# Patient Record
Sex: Female | Born: 2000 | Race: White | Hispanic: No | Marital: Married | Smoking: Never smoker
Health system: Southern US, Community
[De-identification: ages and names within clinical notes are randomized; demographics above are authoritative.]

## PROBLEM LIST (undated history)

## (undated) DIAGNOSIS — N83209 Unspecified ovarian cyst, unspecified side: Secondary | ICD-10-CM

## (undated) HISTORY — DX: Unspecified ovarian cyst, unspecified side: N83.209

## (undated) HISTORY — PX: NO PAST SURGERIES: SHX2092

---
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
## 2018-12-01 ENCOUNTER — Encounter: Payer: Self-pay | Admitting: Emergency Medicine

## 2018-12-01 ENCOUNTER — Emergency Department
Admission: EM | Admit: 2018-12-01 | Discharge: 2018-12-01 | Disposition: A | Discharge status: Home / Self Care | Payer: BLUE CROSS/BLUE SHIELD | Attending: Emergency Medicine | Admitting: Emergency Medicine

## 2018-12-01 DIAGNOSIS — Z79899 Other long term (current) drug therapy: Secondary | ICD-10-CM | POA: Diagnosis not present

## 2018-12-01 DIAGNOSIS — M5442 Lumbago with sciatica, left side: Secondary | ICD-10-CM | POA: Insufficient documentation

## 2018-12-01 DIAGNOSIS — G8929 Other chronic pain: Secondary | ICD-10-CM

## 2018-12-01 DIAGNOSIS — M545 Low back pain: Secondary | ICD-10-CM | POA: Diagnosis present

## 2018-12-01 MED ORDER — PREDNISONE 10 MG PO TABS: ORAL_TABLET | ORAL | 0 refills | Status: DC

## 2018-12-01 MED ORDER — DEXAMETHASONE SODIUM PHOSPHATE 10 MG/ML IJ SOLN
10.0000 mg | Freq: Once | INTRAMUSCULAR | Status: AC
  Administered 2018-12-01: 10 mg via INTRAMUSCULAR
  Filled 2018-12-01: qty 1

## 2018-12-01 MED ORDER — HYDROCODONE-ACETAMINOPHEN 5-325 MG PO TABS: 1.0000 | ORAL_TABLET | Freq: Four times a day (QID) | ORAL | 0 refills | Status: DC | PRN

## 2018-12-01 MED ORDER — OXYCODONE-ACETAMINOPHEN 5-325 MG PO TABS
1.0000 | ORAL_TABLET | Freq: Once | ORAL | Status: AC
  Administered 2018-12-01: 1 via ORAL
  Filled 2018-12-01: qty 1

## 2018-12-01 NOTE — ED Triage Notes (Signed)
First RN Note: Pt presents to ED via POV with c/o lower back and L leg pain. Pt states low back pain since Janurary and L leg pain x several weeks. Pt states was seen at [facility] and was supposed to be scheduled for MRI but has not heard back.

## 2018-12-01 NOTE — ED Notes (Signed)
Pt verbalized understanding of discharge instructions. NAD at this time. 

## 2018-12-01 NOTE — ED Triage Notes (Signed)
Pt to ED with c/o of lower pain that radiates to left leg.

## 2018-12-01 NOTE — ED Notes (Signed)
See triage note  Presents with lower back pain  States pain started in Jan  But has progressively gotten worse  Denies any injury  States pain is moving into left leg

## 2018-12-01 NOTE — Discharge Instructions (Signed)
Follow-up with your orthopedist by phone on Monday if any continued problems.  At this time discontinue taking the meloxicam while you are taking the prednisone.  Prednisone is a taper dose and begin taking today.

## 2018-12-01 NOTE — ED Provider Notes (Signed)
[facility] Emergency Department Provider Note   ____________________________________________   First MD Initiated Contact with Patient 12/01/18 1336     (approximate)  I have reviewed the triage vital signs and the nursing notes.   HISTORY  Chief Complaint Back Pain and Leg Pain   HPI Grace Lopez is a 18 y.o. female presents to the ED with exacerbation of her low back pain.  Patient started experiencing pain in January and has been seen at [facility] when she experienced radiculopathy into her left leg.  She was seen by them this week and states that they are scheduling for her to have an MRI.  She denies any new injury, saddle anesthesias or incontinence of bowel or bladder.  Patient continues to be ambulatory without any assistance.  Currently she rates her pain as 7 out of 10.       There are no active problems to display for this patient.   History reviewed. No pertinent surgical history.  Prior to Admission medications   Medication Sig Start Date End Date Taking? Authorizing Provider  cyclobenzaprine (FLEXERIL) 5 MG tablet Take 5 mg by mouth 3 (three) times daily as needed for muscle spasms.   Yes [provider]  meloxicam (MOBIC) 15 MG tablet Take 15 mg by mouth daily.   Yes [provider]  HYDROcodone-acetaminophen (NORCO/VICODIN) 5-325 MG tablet Take 1 tablet by mouth every 6 (six) hours as needed for moderate pain. 12/01/18   Tommi Rumps, PA-C  predniSONE (DELTASONE) 10 MG tablet Take 6 tablets  today, on day 2 take 5 tablets, day 3 take 4 tablets, day 4 take 3 tablets, day 5 take  2 tablets and 1 tablet the last day 12/01/18   Tommi Rumps, PA-C    Allergies Patient has no known allergies.  History reviewed. No pertinent family history.  Social History Social History   Tobacco Use  . Smoking status: Never Smoker  . Smokeless tobacco: Never Used  Substance Use Topics  . Alcohol use: Never   Frequency: Never  . Drug use: Never    Review of Systems Constitutional: No fever/chills Cardiovascular: Denies chest pain. Respiratory: Denies shortness of breath. Genitourinary: Negative for dysuria. Musculoskeletal: Positive for chronic back pain with left leg sciatica. Skin: Negative for rash. Neurological: Negative for headaches ___________________________________________   PHYSICAL EXAM:  VITAL SIGNS: ED Triage Vitals [12/01/18 1323]  Enc Vitals Group     BP 119/80     Pulse Rate 97     Resp 18     Temp 98.3 F (36.8 C)     Temp Source Oral     SpO2 100 %     Weight 190 lb (86.2 kg)     Height 5\' 3"  (1.6 m)     Head Circumference      Peak Flow      Pain Score 7     Pain Loc      Pain Edu?      Excl. in GC?    Constitutional: Alert and oriented. Well appearing and in no acute distress. Eyes: Conjunctivae are normal.  Head: Atraumatic. Neck: No stridor.   Cardiovascular: Normal rate, regular rhythm. Grossly normal heart sounds.  Good peripheral circulation. Respiratory: Normal respiratory effort.  No retractions. Lungs CTAB. Gastrointestinal: Soft and nontender. No distention.  Musculoskeletal: There is no point tenderness on palpation of the lumbar spine or step-offs appreciated.  There is decreased range of motion secondary to discomfort but no  active muscle spasms were noted.  Good muscle strength bilaterally.  Straight leg raises are approximately 30 degrees bilaterally. Neurologic:  Normal speech and language. No gross focal neurologic deficits are appreciated.  Reflexes are 2+ bilaterally.  No gait instability. Skin:  Skin is warm, dry and intact. No rash noted. Psychiatric: Mood and affect are normal. Speech and behavior are normal.  ____________________________________________   LABS (all labs ordered are listed, but only abnormal results are displayed)  Labs Reviewed - No data to display   PROCEDURES  Procedure(s) performed (including Critical  Care):  Procedures   ____________________________________________   INITIAL IMPRESSION / ASSESSMENT AND PLAN / ED COURSE  As part of my medical decision making, I reviewed the following data within the electronic MEDICAL RECORD NUMBER Notes from prior ED visits and [place] Controlled Substance Database  Patient presents to the ED with complaint of chronic low back pain with left leg radiculopathy.  She states she has been taking meloxicam and Flexeril.  She is currently being seen by [facility] who is arranging for her to have an MRI.  Physical exam is consistent with lower back pain with left leg sciatica.  Patient was given Decadron 10 mg IM with a prescription for prednisone six-day taper and Norco if needed for severe pain.  At this time she is to discontinue taking the meloxicam until she is finished with the prednisone.  She will call her orthopedist on Monday for any continued problems or directions. ____________________________________________   FINAL CLINICAL IMPRESSION(S) / ED DIAGNOSES  Final diagnoses:  Chronic left-sided low back pain with left-sided sciatica     ED Discharge Orders         Ordered    predniSONE (DELTASONE) 10 MG tablet     12/01/18 1426    HYDROcodone-acetaminophen (NORCO/VICODIN) 5-325 MG tablet  Every 6 hours PRN     12/01/18 1431           Note:  This document was prepared using Dragon voice recognition software and may include unintentional dictation errors.    Tommi Rumps, PA-C 12/01/18 1447    Jene Every, MD 12/01/18 [phone]

## 2022-01-24 ENCOUNTER — Other Ambulatory Visit: Payer: Self-pay

## 2022-01-24 ENCOUNTER — Inpatient Hospital Stay (HOSPITAL_COMMUNITY)
Admission: AD | Admit: 2022-01-24 | Discharge: 2022-01-24 | Disposition: A | Discharge status: Home / Self Care | Payer: BC Managed Care – PPO | Attending: Obstetrics & Gynecology | Admitting: Obstetrics & Gynecology

## 2022-01-24 ENCOUNTER — Inpatient Hospital Stay (HOSPITAL_COMMUNITY): Payer: BC Managed Care – PPO

## 2022-01-24 DIAGNOSIS — R109 Unspecified abdominal pain: Secondary | ICD-10-CM | POA: Diagnosis not present

## 2022-01-24 DIAGNOSIS — O26891 Other specified pregnancy related conditions, first trimester: Secondary | ICD-10-CM | POA: Diagnosis not present

## 2022-01-24 DIAGNOSIS — O3680X0 Pregnancy with inconclusive fetal viability, not applicable or unspecified: Secondary | ICD-10-CM | POA: Insufficient documentation

## 2022-01-24 LAB — CBC
HCT: 40.2 % (ref 36.0–46.0)
Hemoglobin: 13.6 g/dL (ref 12.0–15.0)
MCH: 28.9 pg (ref 26.0–34.0)
MCHC: 33.8 g/dL (ref 30.0–36.0)
MCV: 85.4 fL (ref 80.0–100.0)
Platelets: 243 10*3/uL (ref 150–400)
RBC: 4.71 MIL/uL (ref 3.87–5.11)
RDW: 12.7 % (ref 11.5–15.5)
WBC: 11.1 10*3/uL — ABNORMAL HIGH (ref 4.0–10.5)
nRBC: 0 % (ref 0.0–0.2)

## 2022-01-24 LAB — WET PREP, GENITAL
Clue Cells Wet Prep HPF POC: NONE SEEN
Sperm: NONE SEEN
Trich, Wet Prep: NONE SEEN
WBC, Wet Prep HPF POC: <10 (ref <10)
Yeast Wet Prep HPF POC: NONE SEEN

## 2022-01-24 LAB — ABO/RH
ABO/RH(D): B NEG
Antibody Screen: NEGATIVE

## 2022-01-24 LAB — POCT PREGNANCY, URINE: Preg Test, Ur: NEGATIVE

## 2022-01-24 LAB — HCG, QUANTITATIVE, PREGNANCY: hCG, Beta Chain, Quant, S: 11 m[IU]/mL — ABNORMAL HIGH (ref <5)

## 2022-01-24 NOTE — MAU Note (Signed)
.  Grace Lopez is a 21 y.o. at Unknown here in MAU reporting: she has had light spotting for about a week. Took HPT over the weekend and had 2 positives and 1 negative. LMP 11/23/21. Reports some mild cramping on and off as well.  LMP: 11/23/21 Onset of complaint: 1 week Pain score: 0 Vitals:   01/24/22 1608  BP: 132/80  Pulse: 93  Resp: 18  Temp: 98.9 F (37.2 C)     FHT:n/a Lab orders placed from triage:

## 2022-01-24 NOTE — MAU Note (Signed)
Pt in Lobby - called to nurses statin that she was not feeling well felt light headed and weak. Went out to assess pt and pt appear pail and diaphoretic. Notified D. Simpson,CNM. Pt then stated she needsto go to BP. Had a small bm and then assisted pt to room to lay down. See flow sheet for v/s

## 2022-01-24 NOTE — MAU Provider Note (Signed)
History     CSN: [id number]  Arrival date and time: 01/24/22 1554   None     Chief Complaint  Patient presents with   Vaginal Bleeding   HPI Grace Lopez is a 21 y.o. G0P0 who presents to MAU for possible pregnancy. Patient reports that she has recently stopped OCP's in January as she is trying to conceive. She reports her periods have been irregular since stopping the pill and has not had a period since late March. For about 1 week she reports mild intermittent cramping and spotting. She is not currently having any pain at this time, but reports when she went to the bathroom here she noticed a little more than spotting as there was a small amount of bright red blood in underwear. Over the weekend she reports that she had several "faint positive pregnancy tests as well as one negative". [facility] and has an appointment at the end of July or early August.   OB History   No obstetric history on file.     No past medical history on file.  No past surgical history on file.  No family history on file.  Social History   Tobacco Use   Smoking status: Never   Smokeless tobacco: Never  Substance Use Topics   Alcohol use: Never   Drug use: Never    Allergies: No Known Allergies  No medications prior to admission.    Review of Systems  Constitutional: Negative.   Respiratory: Negative.    Cardiovascular: Negative.   Gastrointestinal:  Positive for abdominal pain (cramping).  Genitourinary:  Positive for vaginal bleeding and vaginal discharge. Negative for dysuria.  Musculoskeletal: Negative.   Neurological: Negative.   Physical Exam  Patient Vitals for the past 24 hrs:  BP Temp Pulse Resp SpO2 Height  01/24/22 1959 -- -- -- 20 -- --  01/24/22 1910 (!) 104/57 -- 77 -- -- --  01/24/22 1901 109/62 -- 81 -- -- --  01/24/22 1851 114/65 -- 81 -- -- --  01/24/22 1840 116/71 -- 88 -- -- --  01/24/22 1830 105/68 -- (!) 112 -- -- --  01/24/22 1820 103/72  -- 81 -- -- --  01/24/22 1735 (!) 85/61 -- 83 -- 100 % --  01/24/22 1730 (!) 86/47 -- 62 -- 100 % --  01/24/22 1725 (!) 89/47 -- 62 -- 99 % --  01/24/22 1720 (!) 89/52 -- 70 -- 100 % --  01/24/22 1715 (!) 98/56 -- 70 -- 100 % --  01/24/22 1711 (!) 91/49 -- 71 -- -- --  01/24/22 1710 -- -- -- -- 100 % --  01/24/22 1707 (!) 90/58 -- (!) 55 20 -- --  01/24/22 1608 132/80 98.9 F (37.2 C) 93 18 -- 5\' 3"  (1.6 m)    Physical Exam Vitals and nursing note reviewed.  Constitutional:      General: She is not in acute distress. Eyes:     Extraocular Movements: Extraocular movements intact.     Pupils: Pupils are equal, round, and reactive to light.  Cardiovascular:     Rate and Rhythm: Normal rate.  Pulmonary:     Effort: Pulmonary effort is normal.  Abdominal:     Palpations: Abdomen is soft.     Tenderness: There is no abdominal tenderness.  Musculoskeletal:        General: Normal range of motion.  Skin:    General: Skin is warm and dry.  Neurological:  General: No focal deficit present.     Mental Status: She is alert and oriented to person, place, and time.  Psychiatric:        Mood and Affect: Mood normal.        Behavior: Behavior normal.        Thought Content: Thought content normal.        Judgment: Judgment normal.   US OB LESS THAN 14 WEEKS WITH OB TRANSVAGINAL  Result Date: 01/24/2022 CLINICAL DATA:  Spotting. Estimated gestational age by LMP is 8 weeks 6 days. Quantitative beta HCG is not provided. EXAM: OBSTETRIC <14 WK [place] AND TRANSVAGINAL OB US TECHNIQUE: Both transabdominal and transvaginal ultrasound examinations were performed for complete evaluation of the gestation as well as the maternal uterus, adnexal regions, and pelvic cul-de-sac. Transvaginal technique was performed to assess early pregnancy. COMPARISON:  None Available. FINDINGS: Intrauterine gestational sac: None Yolk sac:  Not Visualized. Embryo:  Not Visualized. Cardiac Activity: Not Visualized.  Maternal uterus/adnexae: Uterus is retroverted. No myometrial mass lesions are identified. Endometrial stripe thickness measures 14 mm. No endometrial fluid or focal abnormality identified. Both ovaries are visualized and appear normal. No abnormal adnexal masses are seen. Minimal free fluid in the pelvis. IMPRESSION: No intrauterine gestational sac, yolk sac, or fetal pole identified. Differential considerations include intrauterine pregnancy too early to be sonographically visualized, missed abortion, or ectopic pregnancy. Followup ultrasound is recommended in 10-14 days for further evaluation. Electronically Signed   By: Burman Nieves M.D.   On: 01/24/2022 18:19    MAU Course  Procedures  MDM UPT negative here HCG ordered  At 1700, RN notified CNM that registration had called her to let us know that patient felt sick, lightheaded, and weak. Patient appeared pale and diaphoretic. Patient informed both CNM and RN that she needed to go to bathroom. Patient went to bathroom in lobby with significant other and had a BM. She was then assisted to room 121 via wheelchair. BP noted to be 90/58 and HR 55. Patient now reporting significant cramping. She reports some bleeding but not enough to wear a pad. Abdomen is non-tender, soft, and there is no guarding. BP remains 80s-90s/50s-60s while HR remains in the 50s-60s. Patient reports feeling much better after lying down. A further history was taken from patient who reports that prior to being on OCP's her periods were always really painful and that "the cramping would get so bad that I would have to black out in order to feel better". I suspect likely vasovagal symptoms.  @1730 , HCG 11 so CBC and ABO/RH were added on to labs. CBC unremarkable. Blood type B neg. Bedside ultrasound also ordered. Wet prep negative, GC/CT pending.   @1915  I reviewed patient presentation and labs with Dr. - Rhogam was held as bHCG was only 11 and pregnancy diagnosis is still  to be determined at this point. Patient has only had spotting x1 week and no significant bleeding. @1920  Patient reports pain has completely resolved. I reviewed results with patient. Patient will follow up in 48 hours at [facility] for repeat bHCG. I contacted Adrian Blackwater, CNM so that she could set this up.  Patient was given strict return precautions.   Assessment and Plan  Pregnancy of unknown location Abdominal pain  - Discharge home in stable condition - Strict return precautions reviewed at length. Patient to return to MAU sooner for worsening symptoms - Follow up at [facility] in 48 hours for repeat bHCG. [facility], CNM to set up  Brand Males, CNM 01/24/2022, 8:19 PM

## 2022-01-25 LAB — GC/CHLAMYDIA PROBE AMP (~~LOC~~) NOT AT ARMC
Chlamydia: NEGATIVE
Comment: NEGATIVE
Comment: NORMAL
Neisseria Gonorrhea: NEGATIVE

## 2022-06-28 LAB — HEPATITIS C ANTIBODY: HCV Ab: NEGATIVE

## 2022-06-28 LAB — OB RESULTS CONSOLE HEPATITIS B SURFACE ANTIGEN: Hepatitis B Surface Ag: NEGATIVE

## 2022-06-28 LAB — OB RESULTS CONSOLE ABO/RH: RH Type: NEGATIVE

## 2022-06-28 LAB — OB RESULTS CONSOLE RUBELLA ANTIBODY, IGM: Rubella: IMMUNE

## 2022-06-28 LAB — OB RESULTS CONSOLE HIV ANTIBODY (ROUTINE TESTING): HIV: NONREACTIVE

## 2022-06-28 LAB — OB RESULTS CONSOLE RPR: RPR: NONREACTIVE

## 2022-06-28 LAB — OB RESULTS CONSOLE ANTIBODY SCREEN: Antibody Screen: NEGATIVE

## 2022-07-26 ENCOUNTER — Other Ambulatory Visit: Payer: Self-pay | Admitting: Obstetrics and Gynecology

## 2022-07-26 DIAGNOSIS — Z3689 Encounter for other specified antenatal screening: Secondary | ICD-10-CM

## 2022-08-01 ENCOUNTER — Inpatient Hospital Stay (HOSPITAL_COMMUNITY)
Admission: AD | Admit: 2022-08-01 | Discharge: 2022-08-02 | Disposition: A | Discharge status: Home / Self Care | Payer: BC Managed Care – PPO | Attending: Obstetrics and Gynecology | Admitting: Obstetrics and Gynecology

## 2022-08-01 ENCOUNTER — Encounter (HOSPITAL_COMMUNITY): Payer: Self-pay

## 2022-08-01 DIAGNOSIS — O26892 Other specified pregnancy related conditions, second trimester: Secondary | ICD-10-CM | POA: Insufficient documentation

## 2022-08-01 DIAGNOSIS — O218 Other vomiting complicating pregnancy: Secondary | ICD-10-CM | POA: Diagnosis not present

## 2022-08-01 DIAGNOSIS — K529 Noninfective gastroenteritis and colitis, unspecified: Secondary | ICD-10-CM | POA: Diagnosis not present

## 2022-08-01 DIAGNOSIS — Z3A15 15 weeks gestation of pregnancy: Secondary | ICD-10-CM | POA: Diagnosis not present

## 2022-08-01 DIAGNOSIS — Z3492 Encounter for supervision of normal pregnancy, unspecified, second trimester: Secondary | ICD-10-CM

## 2022-08-01 LAB — COMPREHENSIVE METABOLIC PANEL
ALT: 23 U/L (ref 0–44)
AST: 30 U/L (ref 15–41)
Albumin: 3.2 g/dL — ABNORMAL LOW (ref 3.5–5.0)
Alkaline Phosphatase: 33 U/L — ABNORMAL LOW (ref 38–126)
Anion gap: 15 (ref 5–15)
BUN: 15 mg/dL (ref 6–20)
CO2: 19 mmol/L — ABNORMAL LOW (ref 22–32)
Calcium: 9.2 mg/dL (ref 8.9–10.3)
Chloride: 102 mmol/L (ref 98–111)
Creatinine, Ser: 0.63 mg/dL (ref 0.44–1.00)
GFR, Estimated: >60 mL/min (ref >60)
Glucose, Bld: 108 mg/dL — ABNORMAL HIGH (ref 70–99)
Potassium: 3.7 mmol/L (ref 3.5–5.1)
Sodium: 136 mmol/L (ref 135–145)
Total Bilirubin: 0.5 mg/dL (ref 0.3–1.2)
Total Protein: 6.9 g/dL (ref 6.5–8.1)

## 2022-08-01 LAB — CBC WITH DIFFERENTIAL/PLATELET
Abs Immature Granulocytes: 0.03 10*3/uL (ref 0.00–0.07)
Basophils Absolute: 0 10*3/uL (ref 0.0–0.1)
Basophils Relative: 0 %
Eosinophils Absolute: 0 10*3/uL (ref 0.0–0.5)
Eosinophils Relative: 0 %
HCT: 31.3 % — ABNORMAL LOW (ref 36.0–46.0)
Hemoglobin: 10.9 g/dL — ABNORMAL LOW (ref 12.0–15.0)
Immature Granulocytes: 1 %
Lymphocytes Relative: 6 %
Lymphs Abs: 0.4 10*3/uL — ABNORMAL LOW (ref 0.7–4.0)
MCH: 29.9 pg (ref 26.0–34.0)
MCHC: 34.8 g/dL (ref 30.0–36.0)
MCV: 85.8 fL (ref 80.0–100.0)
Monocytes Absolute: 0.5 10*3/uL (ref 0.1–1.0)
Monocytes Relative: 7 %
Neutro Abs: 5.4 10*3/uL (ref 1.7–7.7)
Neutrophils Relative %: 86 %
Platelets: 214 10*3/uL (ref 150–400)
RBC: 3.65 MIL/uL — ABNORMAL LOW (ref 3.87–5.11)
RDW: 13.2 % (ref 11.5–15.5)
WBC: 6.3 10*3/uL (ref 4.0–10.5)
nRBC: 0 % (ref 0.0–0.2)

## 2022-08-01 MED ORDER — LACTATED RINGERS IV BOLUS
1000.0000 mL | Freq: Once | INTRAVENOUS | Status: AC
  Administered 2022-08-01: 1000 mL via INTRAVENOUS

## 2022-08-01 MED ORDER — ONDANSETRON HCL 4 MG/2ML IJ SOLN
4.0000 mg | Freq: Once | INTRAMUSCULAR | Status: AC
  Administered 2022-08-01: 4 mg via INTRAVENOUS
  Filled 2022-08-01: qty 2

## 2022-08-01 MED ORDER — ONDANSETRON 4 MG PO TBDP: 4.0000 mg | ORAL_TABLET | Freq: Three times a day (TID) | ORAL | 0 refills | Status: DC | PRN

## 2022-08-01 MED ORDER — FAMOTIDINE IN NACL 20-0.9 MG/50ML-% IV SOLN
20.0000 mg | Freq: Once | INTRAVENOUS | Status: AC
  Administered 2022-08-01: 20 mg via INTRAVENOUS
  Filled 2022-08-01: qty 50

## 2022-08-01 NOTE — MAU Note (Signed)
..  Grace Lopez is a 21 y.o. at Unknown here in MAU reporting: Around 3 am today she started vomiting and having diarrhea. Has vomited 12 times today and had 1 episode of diarrhea this morning. Had a headache earlier that was relieved by tylenol.  Has not been able to keep any fluids down.  Husband had a stomach bug a few days ago.  Gets prenatal care at [place].  Denies vaginal bleeding or  EDD: 01/18/2023  Pain score: 0/10 Vitals:   08/01/22 2008  BP: 120/68  Pulse: (!) 120  Resp: 17  Temp: 100.2 F (37.9 C)  SpO2: 99%     FHT:176 Lab orders placed from triage: UA

## 2022-08-01 NOTE — MAU Provider Note (Signed)
Chief Complaint:  Emesis and Nausea   Event Date/Time   First Provider Initiated Contact with Patient 08/01/22 2035     HPI: Grace Lopez is a 21 y.o. G2P0010 at [redacted]w[redacted]d who presents to maternity admissions reporting vomiting (12x) and diarrhea (once) since 3am this morning. Husband recently had a stomach bug. Had a headache earlier that was relieved by Tylenol but has not been able to keep food or fluids down, feeling very weak and tired. Denies vaginal bleeding or any other physical complaints.  Pregnancy Course: Will receive care at [facility]  History reviewed. No pertinent past medical history. OB History  Gravida Para Term Preterm AB Living  2       1    SAB IAB Ectopic Multiple Live Births  1            # Outcome Date GA Lbr Len/2nd Weight Sex Delivery Anes PTL Lv  2 Current           1 SAB 01/2022           History reviewed. No pertinent surgical history. History reviewed. No pertinent family history. Social History   Tobacco Use   Smoking status: Never   Smokeless tobacco: Never  Substance Use Topics   Alcohol use: Not Currently   Drug use: Never   No Known Allergies No medications prior to admission.    I have reviewed patient's Past Medical Hx, Surgical Hx, Family Hx, Social Hx, medications and allergies.   ROS:  Pertinent items noted in HPI and remainder of comprehensive ROS otherwise negative.   Physical Exam  Patient Vitals for the past 24 hrs:  BP Temp Temp src Pulse Resp SpO2 Height Weight  08/01/22 2008 120/68 100.2 F (37.9 C) Oral (!) 120 17 99 % 5\' 3"  (1.6 m) 183 lb 4.8 oz (83.1 kg)   Constitutional: Well-developed, well-nourished female, pale and weak, nearly passed out walking to the room from triage Cardiovascular: tachycardic on admission but warm and well-perfused Respiratory: normal effort, no problems with respiration noted GI: Abd soft, non-tender MS: Extremities nontender, no edema, normal ROM Neurologic: Alert and  oriented x 4.  GU: no CVA tenderness Pelvic: exam deferred   Labs: Results for orders placed or performed during the hospital encounter of 08/01/22 (from the past 24 hour(s))  CBC with Differential/Platelet     Status: Abnormal   Collection Time: 08/01/22  9:23 PM  Result Value Ref Range   WBC 6.3 4.0 - 10.5 K/uL   RBC 3.65 (L) 3.87 - 5.11 MIL/uL   Hemoglobin 10.9 (L) 12.0 - 15.0 g/dL   HCT 08/03/22 (L) 10.6 - 26.9 %   MCV 85.8 80.0 - 100.0 fL   MCH 29.9 26.0 - 34.0 pg   MCHC 34.8 30.0 - 36.0 g/dL   RDW 48.5 46.2 - 70.3 %   Platelets 214 150 - 400 K/uL   nRBC 0.0 0.0 - 0.2 %   Neutrophils Relative % 86 %   Neutro Abs 5.4 1.7 - 7.7 K/uL   Lymphocytes Relative 6 %   Lymphs Abs 0.4 (L) 0.7 - 4.0 K/uL   Monocytes Relative 7 %   Monocytes Absolute 0.5 0.1 - 1.0 K/uL   Eosinophils Relative 0 %   Eosinophils Absolute 0.0 0.0 - 0.5 K/uL   Basophils Relative 0 %   Basophils Absolute 0.0 0.0 - 0.1 K/uL   Immature Granulocytes 1 %   Abs Immature Granulocytes 0.03 0.00 - 0.07 K/uL  Comprehensive metabolic  panel     Status: Abnormal   Collection Time: 08/01/22  9:23 PM  Result Value Ref Range   Sodium 136 135 - 145 mmol/L   Potassium 3.7 3.5 - 5.1 mmol/L   Chloride 102 98 - 111 mmol/L   CO2 19 (L) 22 - 32 mmol/L   Glucose, Bld 108 (H) 70 - 99 mg/dL   BUN 15 6 - 20 mg/dL   Creatinine, Ser 7.78 0.44 - 1.00 mg/dL   Calcium 9.2 8.9 - 24.2 mg/dL   Total Protein 6.9 6.5 - 8.1 g/dL   Albumin 3.2 (L) 3.5 - 5.0 g/dL   AST 30 15 - 41 U/L   ALT 23 0 - 44 U/L   Alkaline Phosphatase 33 (L) 38 - 126 U/L   Total Bilirubin 0.5 0.3 - 1.2 mg/dL   GFR, Estimated >35 >36 mL/min   Anion gap 15 5 - 15  Urinalysis, Routine w reflex microscopic Urine, Clean Catch     Status: Abnormal   Collection Time: 08/01/22 11:10 PM  Result Value Ref Range   Color, Urine YELLOW YELLOW   APPearance CLEAR CLEAR   Specific Gravity, Urine >1.030 (H) 1.005 - 1.030   pH 6.0 5.0 - 8.0   Glucose, UA NEGATIVE NEGATIVE  mg/dL   Hgb urine dipstick NEGATIVE NEGATIVE   Bilirubin Urine NEGATIVE NEGATIVE   Ketones, ur 40 (A) NEGATIVE mg/dL   Protein, ur NEGATIVE NEGATIVE mg/dL   Nitrite NEGATIVE NEGATIVE   Leukocytes,Ua NEGATIVE NEGATIVE   Imaging:  No results found.  MAU Course: Orders Placed This Encounter  Procedures   Urinalysis, Routine w reflex microscopic Urine, Clean Catch   CBC with Differential/Platelet   Comprehensive metabolic panel   Discharge patient   Meds ordered this encounter  Medications   lactated ringers bolus 1,000 mL   ondansetron (ZOFRAN) injection 4 mg   famotidine (PEPCID) IVPB 20 mg premix   lactated ringers bolus 1,000 mL   ondansetron (ZOFRAN-ODT) 4 MG disintegrating tablet    Sig: Take 1 tablet (4 mg total) by mouth every 8 (eight) hours as needed for nausea or vomiting.    Dispense:  15 tablet    Refill:  0    Order Specific Question:   Supervising Provider    Answer:   Reva Bores [[id number]]   Pt pale and weak on admission to MAU, very nauseated. Ordered 2L LR bolus, zofran and pepcid, labs and UA. Symptoms relieved by meds and pt stable to go home. Short course of zofran ordered in case of vomiting recurrence.  MDM: Low  Assessment: 1. Gastroenteritis   2. [redacted] weeks gestation of pregnancy   3. Presence of fetal heart sounds in second trimester    Plan: Discharge home in stable condition.     Follow-up Information     [facility], [facility] Follow up.   Specialty: Obstetrics and Gynecology Why: as scheduled for ongoing prenatal care Contact information: [address] [place] [address] [phone]                 Allergies as of 08/02/2022   No Known Allergies      Medication List     TAKE these medications    acetaminophen 500 MG tablet Commonly known as: TYLENOL Take 500 mg by mouth every 6 (six) hours as needed for headache.   multivitamin-prenatal 27-0.8 MG Tabs tablet Take 1 tablet by mouth daily at 12  noon.   ondansetron 4 MG disintegrating tablet  Commonly known as: ZOFRAN-ODT Take 1 tablet (4 mg total) by mouth every 8 (eight) hours as needed for nausea or vomiting.       Edd Arbour, CNM, MSN, IBCLC Certified Nurse Midwife, [facility]

## 2022-08-01 NOTE — MAU Provider Note (Incomplete)
Chief Complaint:  Emesis and Nausea   Event Date/Time   First Provider Initiated Contact with Patient 08/01/22 2035     HPI: Grace Lopez is a 21 y.o. G2P0010 at [redacted]w[redacted]d who presents to maternity admissions reporting ***. Denies vaginal bleeding, leaking of fluid, decreased fetal movement, fever, falls, or recent illness.   Pregnancy Course: ***  History reviewed. No pertinent past medical history. OB History  Gravida Para Term Preterm AB Living  2       1    SAB IAB Ectopic Multiple Live Births  1            # Outcome Date GA Lbr Len/2nd Weight Sex Delivery Anes PTL Lv  2 Current           1 SAB 01/2022           History reviewed. No pertinent surgical history. History reviewed. No pertinent family history. Social History   Tobacco Use  . Smoking status: Never  . Smokeless tobacco: Never  Substance Use Topics  . Alcohol use: Not Currently  . Drug use: Never   No Known Allergies Medications Prior to Admission  Medication Sig Dispense Refill Last Dose  . acetaminophen (TYLENOL) 500 MG tablet Take 500 mg by mouth every 6 (six) hours as needed for headache.   08/01/2022  . Prenatal Vit-Fe Fumarate-FA (MULTIVITAMIN-PRENATAL) 27-0.8 MG TABS tablet Take 1 tablet by mouth daily at 12 noon.   08/01/2022    I have reviewed patient's Past Medical Hx, Surgical Hx, Family Hx, Social Hx, medications and allergies.   ROS:  Pertinent items noted in HPI and remainder of comprehensive ROS otherwise negative.   Physical Exam  Patient Vitals for the past 24 hrs:  BP Temp Temp src Pulse Resp SpO2 Height Weight  08/01/22 2008 120/68 100.2 F (37.9 C) Oral (!) 120 17 99 % 5\' 3"  (1.6 m) 183 lb 4.8 oz (83.1 kg)    Constitutional: Well-developed, well-nourished female in no acute distress.  Cardiovascular: normal rate & rhythm, warm and well-perfused Respiratory: normal effort, no problems with respiration noted GI: Abd soft, non-tender, gravid appropriate for gestational age MS:  Extremities nontender, no edema, normal ROM Neurologic: Alert and oriented x 4.  GU: no CVA tenderness Pelvic: NEFG, physiologic discharge, no blood, cervix clean.      Fetal Tracing: Baseline: Variability: Accelerations:  Decelerations: Toco:    Labs: Results for orders placed or performed during the hospital encounter of 08/01/22 (from the past 24 hour(s))  CBC with Differential/Platelet     Status: Abnormal   Collection Time: 08/01/22  9:23 PM  Result Value Ref Range   WBC 6.3 4.0 - 10.5 K/uL   RBC 3.65 (L) 3.87 - 5.11 MIL/uL   Hemoglobin 10.9 (L) 12.0 - 15.0 g/dL   HCT 08/03/22 (L) 82.5 - 05.3 %   MCV 85.8 80.0 - 100.0 fL   MCH 29.9 26.0 - 34.0 pg   MCHC 34.8 30.0 - 36.0 g/dL   RDW 97.6 73.4 - 19.3 %   Platelets 214 150 - 400 K/uL   nRBC 0.0 0.0 - 0.2 %   Neutrophils Relative % 86 %   Neutro Abs 5.4 1.7 - 7.7 K/uL   Lymphocytes Relative 6 %   Lymphs Abs 0.4 (L) 0.7 - 4.0 K/uL   Monocytes Relative 7 %   Monocytes Absolute 0.5 0.1 - 1.0 K/uL   Eosinophils Relative 0 %   Eosinophils Absolute 0.0 0.0 - 0.5 K/uL   Basophils  Relative 0 %   Basophils Absolute 0.0 0.0 - 0.1 K/uL   Immature Granulocytes 1 %   Abs Immature Granulocytes 0.03 0.00 - 0.07 K/uL  Comprehensive metabolic panel     Status: Abnormal   Collection Time: 08/01/22  9:23 PM  Result Value Ref Range   Sodium 136 135 - 145 mmol/L   Potassium 3.7 3.5 - 5.1 mmol/L   Chloride 102 98 - 111 mmol/L   CO2 19 (L) 22 - 32 mmol/L   Glucose, Bld 108 (H) 70 - 99 mg/dL   BUN 15 6 - 20 mg/dL   Creatinine, Ser 8.12 0.44 - 1.00 mg/dL   Calcium 9.2 8.9 - 75.1 mg/dL   Total Protein 6.9 6.5 - 8.1 g/dL   Albumin 3.2 (L) 3.5 - 5.0 g/dL   AST 30 15 - 41 U/L   ALT 23 0 - 44 U/L   Alkaline Phosphatase 33 (L) 38 - 126 U/L   Total Bilirubin 0.5 0.3 - 1.2 mg/dL   GFR, Estimated >70 >01 mL/min   Anion gap 15 5 - 15    Imaging:  No results found.  MAU Course: Orders Placed This Encounter  Procedures  . Urinalysis,  Routine w reflex microscopic Urine, Clean Catch  . CBC with Differential/Platelet  . Comprehensive metabolic panel  . Discharge patient   Meds ordered this encounter  Medications  . lactated ringers bolus 1,000 mL  . ondansetron (ZOFRAN) injection 4 mg  . famotidine (PEPCID) IVPB 20 mg premix  . lactated ringers bolus 1,000 mL  . ondansetron (ZOFRAN-ODT) 4 MG disintegrating tablet    Sig: Take 1 tablet (4 mg total) by mouth every 8 (eight) hours as needed for nausea or vomiting.    Dispense:  15 tablet    Refill:  0    Order Specific Question:   Supervising Provider    Answer:   Samara Snide    MDM:  Assessment: 1. Gastroenteritis   2. [redacted] weeks gestation of pregnancy   3. Presence of fetal heart sounds in second trimester     Plan: Discharge home in stable condition.  ***   Follow-up Information     Ob/Gyn, [facility] Follow up.   Specialty: Obstetrics and Gynecology Why: as scheduled for ongoing prenatal care Contact information: [address] [place] [address] [phone]                 Allergies as of 08/01/2022   No Known Allergies      Medication List     TAKE these medications    acetaminophen 500 MG tablet Commonly known as: TYLENOL Take 500 mg by mouth every 6 (six) hours as needed for headache.   multivitamin-prenatal 27-0.8 MG Tabs tablet Take 1 tablet by mouth daily at 12 noon.   ondansetron 4 MG disintegrating tablet Commonly known as: ZOFRAN-ODT Take 1 tablet (4 mg total) by mouth every 8 (eight) hours as needed for nausea or vomiting.        Edd Arbour, CNM, MSN, IBCLC Certified Nurse Midwife, [facility]

## 2022-08-02 LAB — URINALYSIS, ROUTINE W REFLEX MICROSCOPIC
Bilirubin Urine: NEGATIVE
Glucose, UA: NEGATIVE mg/dL
Hgb urine dipstick: NEGATIVE
Ketones, ur: 40 mg/dL — AB
Leukocytes,Ua: NEGATIVE
Nitrite: NEGATIVE
Protein, ur: NEGATIVE mg/dL
Specific Gravity, Urine: >1.03 — ABNORMAL HIGH (ref 1.005–1.030)
pH: 6 (ref 5.0–8.0)

## 2022-08-08 LAB — OB RESULTS CONSOLE GC/CHLAMYDIA
Chlamydia: NEGATIVE
Neisseria Gonorrhea: NEGATIVE

## 2022-08-25 ENCOUNTER — Other Ambulatory Visit: Payer: BC Managed Care – PPO

## 2022-08-25 ENCOUNTER — Ambulatory Visit: Payer: BC Managed Care – PPO

## 2022-09-06 ENCOUNTER — Ambulatory Visit: Payer: BC Managed Care – PPO | Admitting: *Deleted

## 2022-09-06 ENCOUNTER — Ambulatory Visit: Payer: BC Managed Care – PPO | Attending: Obstetrics and Gynecology

## 2022-09-06 VITALS — BP 130/62 | HR 91

## 2022-09-06 DIAGNOSIS — O99019 Anemia complicating pregnancy, unspecified trimester: Secondary | ICD-10-CM | POA: Diagnosis present

## 2022-09-06 DIAGNOSIS — D509 Iron deficiency anemia, unspecified: Secondary | ICD-10-CM

## 2022-09-06 DIAGNOSIS — Z3689 Encounter for other specified antenatal screening: Secondary | ICD-10-CM | POA: Insufficient documentation

## 2022-09-06 NOTE — Progress Notes (Signed)
175lb

## 2022-11-13 ENCOUNTER — Inpatient Hospital Stay (HOSPITAL_COMMUNITY)
Admission: AD | Admit: 2022-11-13 | Discharge: 2022-11-13 | Disposition: A | Discharge status: Home / Self Care | Payer: BC Managed Care – PPO | Attending: Obstetrics and Gynecology | Admitting: Obstetrics and Gynecology

## 2022-11-13 ENCOUNTER — Encounter (HOSPITAL_COMMUNITY): Payer: Self-pay | Admitting: Obstetrics and Gynecology

## 2022-11-13 DIAGNOSIS — Z3A30 30 weeks gestation of pregnancy: Secondary | ICD-10-CM | POA: Insufficient documentation

## 2022-11-13 DIAGNOSIS — E876 Hypokalemia: Secondary | ICD-10-CM | POA: Diagnosis not present

## 2022-11-13 DIAGNOSIS — O99283 Endocrine, nutritional and metabolic diseases complicating pregnancy, third trimester: Secondary | ICD-10-CM | POA: Diagnosis not present

## 2022-11-13 DIAGNOSIS — K529 Noninfective gastroenteritis and colitis, unspecified: Secondary | ICD-10-CM | POA: Insufficient documentation

## 2022-11-13 DIAGNOSIS — O218 Other vomiting complicating pregnancy: Secondary | ICD-10-CM | POA: Insufficient documentation

## 2022-11-13 DIAGNOSIS — Z1152 Encounter for screening for COVID-19: Secondary | ICD-10-CM | POA: Diagnosis not present

## 2022-11-13 DIAGNOSIS — O99613 Diseases of the digestive system complicating pregnancy, third trimester: Secondary | ICD-10-CM | POA: Diagnosis not present

## 2022-11-13 DIAGNOSIS — O368130 Decreased fetal movements, third trimester, not applicable or unspecified: Secondary | ICD-10-CM | POA: Diagnosis not present

## 2022-11-13 DIAGNOSIS — K30 Functional dyspepsia: Secondary | ICD-10-CM | POA: Diagnosis not present

## 2022-11-13 LAB — COMPREHENSIVE METABOLIC PANEL
ALT: 19 U/L (ref 0–44)
AST: 24 U/L (ref 15–41)
Albumin: 2.9 g/dL — ABNORMAL LOW (ref 3.5–5.0)
Alkaline Phosphatase: 43 U/L (ref 38–126)
Anion gap: 10 (ref 5–15)
BUN: 7 mg/dL (ref 6–20)
CO2: 20 mmol/L — ABNORMAL LOW (ref 22–32)
Calcium: 8.4 mg/dL — ABNORMAL LOW (ref 8.9–10.3)
Chloride: 105 mmol/L (ref 98–111)
Creatinine, Ser: 0.59 mg/dL (ref 0.44–1.00)
GFR, Estimated: >60 mL/min (ref >60)
Glucose, Bld: 93 mg/dL (ref 70–99)
Potassium: 3.2 mmol/L — ABNORMAL LOW (ref 3.5–5.1)
Sodium: 135 mmol/L (ref 135–145)
Total Bilirubin: 0.6 mg/dL (ref 0.3–1.2)
Total Protein: 6.3 g/dL — ABNORMAL LOW (ref 6.5–8.1)

## 2022-11-13 LAB — CBC
HCT: 29.1 % — ABNORMAL LOW (ref 36.0–46.0)
Hemoglobin: 10.1 g/dL — ABNORMAL LOW (ref 12.0–15.0)
MCH: 30.3 pg (ref 26.0–34.0)
MCHC: 34.7 g/dL (ref 30.0–36.0)
MCV: 87.4 fL (ref 80.0–100.0)
Platelets: 133 10*3/uL — ABNORMAL LOW (ref 150–400)
RBC: 3.33 MIL/uL — ABNORMAL LOW (ref 3.87–5.11)
RDW: 13.4 % (ref 11.5–15.5)
WBC: 4.1 10*3/uL (ref 4.0–10.5)
nRBC: 0 % (ref 0.0–0.2)

## 2022-11-13 LAB — URINALYSIS, ROUTINE W REFLEX MICROSCOPIC
Bilirubin Urine: NEGATIVE
Glucose, UA: NEGATIVE mg/dL
Hgb urine dipstick: NEGATIVE
Ketones, ur: 20 mg/dL — AB
Leukocytes,Ua: NEGATIVE
Nitrite: NEGATIVE
Protein, ur: NEGATIVE mg/dL
Specific Gravity, Urine: 1.01 (ref 1.005–1.030)
pH: 5 (ref 5.0–8.0)

## 2022-11-13 LAB — RESP PANEL BY RT-PCR (RSV, FLU A&B, COVID)  RVPGX2
Influenza A by PCR: NEGATIVE
Influenza B by PCR: NEGATIVE
Resp Syncytial Virus by PCR: NEGATIVE
SARS Coronavirus 2 by RT PCR: NEGATIVE

## 2022-11-13 MED ORDER — LOPERAMIDE HCL 2 MG PO CAPS
2.0000 mg | ORAL_CAPSULE | Freq: Once | ORAL | Status: AC
  Administered 2022-11-13: 2 mg via ORAL
  Filled 2022-11-13: qty 1

## 2022-11-13 MED ORDER — LOPERAMIDE HCL 2 MG PO CAPS: 2.0000 mg | ORAL_CAPSULE | Freq: Four times a day (QID) | ORAL | 0 refills | Status: DC | PRN

## 2022-11-13 MED ORDER — ONDANSETRON 4 MG PO TBDP: 4.0000 mg | ORAL_TABLET | Freq: Three times a day (TID) | ORAL | 0 refills | Status: DC | PRN

## 2022-11-13 MED ORDER — POTASSIUM CHLORIDE CRYS ER 20 MEQ PO TBCR: 20.0000 meq | EXTENDED_RELEASE_TABLET | Freq: Two times a day (BID) | ORAL | 0 refills | Status: DC

## 2022-11-13 MED ORDER — ONDANSETRON HCL 4 MG/2ML IJ SOLN
4.0000 mg | Freq: Once | INTRAMUSCULAR | Status: AC
  Administered 2022-11-13: 4 mg via INTRAVENOUS
  Filled 2022-11-13: qty 2

## 2022-11-13 MED ORDER — LACTATED RINGERS IV BOLUS
1000.0000 mL | Freq: Once | INTRAVENOUS | Status: AC
  Administered 2022-11-13: 1000 mL via INTRAVENOUS

## 2022-11-13 NOTE — MAU Provider Note (Signed)
History     CSN: [id number]  Arrival date and time: 11/13/22 1026   None     Chief Complaint  Patient presents with   Abdominal Pain   Nausea   Fever   Diarrhea   HPI Grace Lopez is a 22 y.o. G2P0010 at 67w4dwho presents to MAU for fever, nausea, diarrhea, and decreased fetal movement. Patient reports nausea started Thursday. Started getting indigestion on Friday, and by Saturday was having a fever and diarrhea. She reports she had several episodes of watery diarrhea last night and she had a temperature of 100.3. She denies any episodes of vomiting. She denies any dietary changes or concerns for food-born illness. 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 She reports mild lwoer abdominal cramping that "is not too concerning". She denies vaginal bleeding, leaking fluid, cough, congestion, sore throat. She reports fetal movement was decreased this morning but reports normal movement now.    [facility]  OB History     Gravida  2   Para      Term      Preterm      AB  1   Living         SAB  1   IAB      Ectopic      Multiple      Live Births              Past Medical History:  Diagnosis Date   Ovarian cyst     Past Surgical History:  Procedure Laterality Date   NO PAST SURGERIES      Family History  Problem Relation Age of Onset   Asthma Brother    Diabetes Maternal Grandfather    Cancer Neg Hx    Heart disease Neg Hx    Hypertension Neg Hx     Social History   Tobacco Use   Smoking status: Never   Smokeless tobacco: Never  Vaping Use   Vaping Use: Never used  Substance Use Topics   Alcohol use: Not Currently   Drug use: Never    Allergies: No Known Allergies  No medications prior to admission.   Review of Systems  Constitutional:  Positive for fever.  Gastrointestinal:  Positive for diarrhea and nausea.   All other systems reviewed and are negative.  Physical Exam  Patient Vitals for the past 24 hrs:  BP Temp Temp src Pulse Resp SpO2 Height Weight  11/13/22 1318 (!) 97/48 -- -- (!) 104 18 -- -- --  11/13/22 1046 114/70 98.6 F (37 C) Oral (!) 112 20 100 % '5\' 3"'$  (1.6 m) 97.3 kg   Physical Exam Vitals and nursing note reviewed.  Constitutional:      General: She is not in acute distress.    Appearance: She is obese.  Eyes:     Extraocular Movements: Extraocular movements intact.     Pupils: Pupils are equal, round, and reactive to light.  Cardiovascular:     Rate and Rhythm: Tachycardia present.  Pulmonary:     Effort: Pulmonary effort is normal. No respiratory distress.  Abdominal:     Palpations: Abdomen is soft.     Tenderness: There is no abdominal tenderness.     Comments: Gravid    Skin:    General: Skin is warm and dry.  Neurological:     General:  No focal deficit present.     Mental Status: She is alert and oriented to person, place, and time.  Psychiatric:        Mood and Affect: Mood normal.        Behavior: Behavior normal.   NST FHR: 135 bpm, moderate variability, +15x15 accels, no decels Toco: quiet  Results for orders placed or performed during the hospital encounter of 11/13/22 (from the past 24 hour(s))  Urinalysis, Routine w reflex microscopic -Urine, Clean Catch     Status: Abnormal   Collection Time: 11/13/22 10:50 AM  Result Value Ref Range   Color, Urine YELLOW YELLOW   APPearance CLEAR CLEAR   Specific Gravity, Urine 1.010 1.005 - 1.030   pH 5.0 5.0 - 8.0   Glucose, UA NEGATIVE NEGATIVE mg/dL   Hgb urine dipstick NEGATIVE NEGATIVE   Bilirubin Urine NEGATIVE NEGATIVE   Ketones, ur 20 (A) NEGATIVE mg/dL   Protein, ur NEGATIVE NEGATIVE mg/dL   Nitrite NEGATIVE NEGATIVE   Leukocytes,Ua NEGATIVE NEGATIVE  Resp panel by RT-PCR (RSV, Flu A&B, Covid) Anterior Nasal Swab     Status: None   Collection Time: 11/13/22 11:19 AM   Specimen: Anterior  Nasal Swab  Result Value Ref Range   SARS Coronavirus 2 by RT PCR NEGATIVE NEGATIVE   Influenza A by PCR NEGATIVE NEGATIVE   Influenza B by PCR NEGATIVE NEGATIVE   Resp Syncytial Virus by PCR NEGATIVE NEGATIVE  CBC     Status: Abnormal   Collection Time: 11/13/22 12:11 PM  Result Value Ref Range   WBC 4.1 4.0 - 10.5 K/uL   RBC 3.33 (L) 3.87 - 5.11 MIL/uL   Hemoglobin 10.1 (L) 12.0 - 15.0 g/dL   HCT 29.1 (L) 36.0 - 46.0 %   MCV 87.4 80.0 - 100.0 fL   MCH 30.3 26.0 - 34.0 pg   MCHC 34.7 30.0 - 36.0 g/dL   RDW 13.4 11.5 - 15.5 %   Platelets 133 (L) 150 - 400 K/uL   nRBC 0.0 0.0 - 0.2 %  Comprehensive metabolic panel     Status: Abnormal   Collection Time: 11/13/22 12:11 PM  Result Value Ref Range   Sodium 135 135 - 145 mmol/L   Potassium 3.2 (L) 3.5 - 5.1 mmol/L   Chloride 105 98 - 111 mmol/L   CO2 20 (L) 22 - 32 mmol/L   Glucose, Bld 93 70 - 99 mg/dL   BUN 7 6 - 20 mg/dL   Creatinine, Ser 0.59 0.44 - 1.00 mg/dL   Calcium 8.4 (L) 8.9 - 10.3 mg/dL   Total Protein 6.3 (L) 6.5 - 8.1 g/dL   Albumin 2.9 (L) 3.5 - 5.0 g/dL   AST 24 15 - 41 U/L   ALT 19 0 - 44 U/L   Alkaline Phosphatase 43 38 - 126 U/L   Total Bilirubin 0.6 0.3 - 1.2 mg/dL   GFR, Estimated >60 >60 mL/min   Anion gap 10 5 - 15    MAU Course  Procedures  MDM UA LR bolus Zofran IV, Imodium CBC, CMP NST  Patient given LR bolus with IV Zofran. She was also given a dose of PO Imodium, UA with mild ketonuria but otherwise normal. Labs reassuring. Potassium noted to be mildly low at 3.2. NST reassuring for gestational age, toco quiet. Patient reports feeling much better after IVF's. Will send patient home with anti-emetics and a 2-3 days course of PO K+ supplement given diarrheal illness.   Assessment and Plan  1. [redacted] weeks gestation of pregnancy   2. Gastroenteritis   3. Hypokalemia    - Discharge home in stable condition - Rx for K-Dur, Zofran, and Imodium - Return precautions given. Return to MAU for  new/worsening symptoms - Keep OB appointment as scheduled   Renee Harder, CNM 11/13/2022, 3:47 PM

## 2022-11-13 NOTE — MAU Note (Signed)
Grace Lopez is a 22 y.o. at 47w4dhere in MAU reporting: Thursday started feeling nauseous.  This lasted through Friday.  Friday she started having indigestion.  Diarrhea, Fever, chills and sweats started on Sat,  nausea continued, never vomited. Highest temp 100.3 last night. Liquid stools, several times, just once this morning. Baby is not moving as much as usual. No bleeding or leaking.  No one else at home is sick  Onset of complaint: Thursday Pain score: 2 cramping on rt side Vitals:   11/13/22 1046  BP: 114/70  Pulse: (!) 112  Resp: 20  Temp: 98.6 F (37 C)  SpO2: 100%     FHT:[id number] Lab orders placed from triage:  ua

## 2022-11-22 ENCOUNTER — Other Ambulatory Visit: Payer: Self-pay

## 2022-11-25 ENCOUNTER — Other Ambulatory Visit: Payer: Self-pay | Admitting: Obstetrics and Gynecology

## 2022-11-25 DIAGNOSIS — Z363 Encounter for antenatal screening for malformations: Secondary | ICD-10-CM

## 2022-12-08 ENCOUNTER — Telehealth: Payer: Self-pay

## 2022-12-08 NOTE — Telephone Encounter (Signed)
Patient can not reschedule 12/12/22 ultrasound appointment due to work schedule.

## 2022-12-12 ENCOUNTER — Ambulatory Visit: Payer: BC Managed Care – PPO | Admitting: *Deleted

## 2022-12-12 ENCOUNTER — Ambulatory Visit: Payer: BC Managed Care – PPO | Attending: Obstetrics and Gynecology

## 2022-12-12 VITALS — BP 122/66 | HR 79

## 2022-12-12 DIAGNOSIS — O26843 Uterine size-date discrepancy, third trimester: Secondary | ICD-10-CM

## 2022-12-12 DIAGNOSIS — Z363 Encounter for antenatal screening for malformations: Secondary | ICD-10-CM | POA: Insufficient documentation

## 2022-12-30 LAB — OB RESULTS CONSOLE GBS: GBS: NEGATIVE

## 2023-01-13 ENCOUNTER — Other Ambulatory Visit: Payer: Self-pay | Admitting: Obstetrics and Gynecology

## 2023-01-16 ENCOUNTER — Telehealth (HOSPITAL_COMMUNITY): Payer: Self-pay | Admitting: *Deleted

## 2023-01-18 ENCOUNTER — Encounter (HOSPITAL_COMMUNITY): Payer: Self-pay | Admitting: *Deleted

## 2023-01-18 NOTE — Telephone Encounter (Signed)
Preadmission screen  

## 2023-01-22 ENCOUNTER — Encounter: Payer: Self-pay | Admitting: Obstetrics & Gynecology

## 2023-01-23 ENCOUNTER — Encounter (HOSPITAL_COMMUNITY): Payer: Self-pay | Admitting: Obstetrics and Gynecology

## 2023-01-23 ENCOUNTER — Inpatient Hospital Stay (HOSPITAL_COMMUNITY): Payer: BC Managed Care – PPO | Admitting: Anesthesiology

## 2023-01-23 ENCOUNTER — Inpatient Hospital Stay (HOSPITAL_COMMUNITY)
Admission: RE | Admit: 2023-01-23 | Discharge: 2023-01-26 | DRG: 787 | Disposition: A | Discharge status: Home / Self Care | Payer: BC Managed Care – PPO | Attending: Obstetrics and Gynecology | Admitting: Obstetrics and Gynecology

## 2023-01-23 ENCOUNTER — Other Ambulatory Visit: Payer: Self-pay

## 2023-01-23 DIAGNOSIS — O3663X0 Maternal care for excessive fetal growth, third trimester, not applicable or unspecified: Secondary | ICD-10-CM | POA: Diagnosis present

## 2023-01-23 DIAGNOSIS — O99214 Obesity complicating childbirth: Secondary | ICD-10-CM | POA: Diagnosis present

## 2023-01-23 DIAGNOSIS — Z98891 History of uterine scar from previous surgery: Secondary | ICD-10-CM

## 2023-01-23 DIAGNOSIS — Z3A40 40 weeks gestation of pregnancy: Secondary | ICD-10-CM

## 2023-01-23 DIAGNOSIS — O324XX0 Maternal care for high head at term, not applicable or unspecified: Secondary | ICD-10-CM | POA: Diagnosis present

## 2023-01-23 DIAGNOSIS — O480 Post-term pregnancy: Secondary | ICD-10-CM | POA: Diagnosis present

## 2023-01-23 DIAGNOSIS — D62 Acute posthemorrhagic anemia: Secondary | ICD-10-CM | POA: Diagnosis not present

## 2023-01-23 DIAGNOSIS — Z23 Encounter for immunization: Secondary | ICD-10-CM | POA: Diagnosis not present

## 2023-01-23 DIAGNOSIS — O9081 Anemia of the puerperium: Secondary | ICD-10-CM | POA: Diagnosis not present

## 2023-01-23 DIAGNOSIS — Z20822 Contact with and (suspected) exposure to covid-19: Secondary | ICD-10-CM | POA: Diagnosis present

## 2023-01-23 DIAGNOSIS — M955 Acquired deformity of pelvis: Secondary | ICD-10-CM | POA: Diagnosis present

## 2023-01-23 LAB — CBC
HCT: 32.2 % — ABNORMAL LOW (ref 36.0–46.0)
Hemoglobin: 10.5 g/dL — ABNORMAL LOW (ref 12.0–15.0)
MCH: 29.1 pg (ref 26.0–34.0)
MCHC: 32.6 g/dL (ref 30.0–36.0)
MCV: 89.2 fL (ref 80.0–100.0)
Platelets: 143 10*3/uL — ABNORMAL LOW (ref 150–400)
RBC: 3.61 MIL/uL — ABNORMAL LOW (ref 3.87–5.11)
RDW: 13.6 % (ref 11.5–15.5)
WBC: 8.3 10*3/uL (ref 4.0–10.5)
nRBC: 0 % (ref 0.0–0.2)

## 2023-01-23 LAB — RPR: RPR Ser Ql: NONREACTIVE

## 2023-01-23 MED ORDER — LIDOCAINE-EPINEPHRINE (PF) 1.5 %-1:200000 IJ SOLN
INTRAMUSCULAR | Status: DC | PRN
  Administered 2023-01-23: 5 mL via EPIDURAL

## 2023-01-23 MED ORDER — EPHEDRINE 5 MG/ML INJ: 10.0000 mg | INTRAVENOUS | Status: DC | PRN

## 2023-01-23 MED ORDER — ONDANSETRON HCL 4 MG/2ML IJ SOLN: 4.0000 mg | Freq: Four times a day (QID) | INTRAMUSCULAR | Status: DC | PRN

## 2023-01-23 MED ORDER — FENTANYL-BUPIVACAINE-NACL 0.5-0.125-0.9 MG/250ML-% EP SOLN
12.0000 mL/h | EPIDURAL | Status: DC | PRN
  Administered 2023-01-23: 12 mL/h via EPIDURAL
  Filled 2023-01-23: qty 250

## 2023-01-23 MED ORDER — ACETAMINOPHEN 325 MG PO TABS: 650.0000 mg | ORAL_TABLET | ORAL | Status: DC | PRN

## 2023-01-23 MED ORDER — LACTATED RINGERS IV SOLN: 500.0000 mL | INTRAVENOUS | Status: DC | PRN

## 2023-01-23 MED ORDER — PHENYLEPHRINE 80 MCG/ML (10ML) SYRINGE FOR IV PUSH (FOR BLOOD PRESSURE SUPPORT): 80.0000 ug | PREFILLED_SYRINGE | INTRAVENOUS | Status: DC | PRN

## 2023-01-23 MED ORDER — TERBUTALINE SULFATE 1 MG/ML IJ SOLN
0.2500 mg | Freq: Once | INTRAMUSCULAR | Status: DC | PRN
  Filled 2023-01-23: qty 1

## 2023-01-23 MED ORDER — FENTANYL CITRATE (PF) 100 MCG/2ML IJ SOLN
100.0000 ug | INTRAMUSCULAR | Status: DC | PRN
  Filled 2023-01-23: qty 2

## 2023-01-23 MED ORDER — LIDOCAINE HCL (PF) 1 % IJ SOLN: 30.0000 mL | INTRAMUSCULAR | Status: DC | PRN

## 2023-01-23 MED ORDER — MISOPROSTOL 25 MCG QUARTER TABLET
25.0000 ug | ORAL_TABLET | Freq: Once | ORAL | Status: AC
  Administered 2023-01-23: 25 ug via VAGINAL
  Filled 2023-01-23: qty 1

## 2023-01-23 MED ORDER — MISOPROSTOL 25 MCG QUARTER TABLET: 25.0000 ug | ORAL_TABLET | ORAL | Status: DC | PRN

## 2023-01-23 MED ORDER — SOD CITRATE-CITRIC ACID 500-334 MG/5ML PO SOLN: 30.0000 mL | ORAL | Status: DC | PRN

## 2023-01-23 MED ORDER — OXYTOCIN-SODIUM CHLORIDE 30-0.9 UT/500ML-% IV SOLN
1.0000 m[IU]/min | INTRAVENOUS | Status: DC
  Administered 2023-01-24: 2 m[IU]/min via INTRAVENOUS
  Filled 2023-01-23: qty 500

## 2023-01-23 MED ORDER — LACTATED RINGERS IV SOLN: 500.0000 mL | Freq: Once | INTRAVENOUS | Status: DC

## 2023-01-23 MED ORDER — LACTATED RINGERS IV SOLN: INTRAVENOUS | Status: DC

## 2023-01-23 MED ORDER — FLEET ENEMA 7-19 GM/118ML RE ENEM: 1.0000 | ENEMA | RECTAL | Status: DC | PRN

## 2023-01-23 MED ORDER — LIDOCAINE HCL (PF) 1 % IJ SOLN
INTRAMUSCULAR | Status: DC | PRN
  Administered 2023-01-23: 5 mL via EPIDURAL

## 2023-01-23 MED ORDER — MISOPROSTOL 50MCG HALF TABLET
50.0000 ug | ORAL_TABLET | Freq: Once | ORAL | Status: AC
  Administered 2023-01-23: 50 ug via BUCCAL
  Filled 2023-01-23: qty 1

## 2023-01-23 MED ORDER — DIPHENHYDRAMINE HCL 50 MG/ML IJ SOLN: 12.5000 mg | INTRAMUSCULAR | Status: DC | PRN

## 2023-01-23 MED ORDER — OXYTOCIN BOLUS FROM INFUSION: 333.0000 mL | Freq: Once | INTRAVENOUS | Status: DC

## 2023-01-23 MED ORDER — OXYTOCIN-SODIUM CHLORIDE 30-0.9 UT/500ML-% IV SOLN: 2.5000 [IU]/h | INTRAVENOUS | Status: DC

## 2023-01-23 NOTE — Anesthesia Preprocedure Evaluation (Signed)
Anesthesia Evaluation  Patient identified by MRN, date of birth, ID band Patient awake    Reviewed: Allergy & Precautions, H&P , NPO status , Patient's Chart, lab work & pertinent test results  History of Anesthesia Complications Negative for: history of anesthetic complications  Airway Mallampati: II  TM Distance: >3 FB Neck ROM: full    Dental no notable dental hx. (+) Teeth Intact   Pulmonary neg pulmonary ROS   Pulmonary exam normal breath sounds clear to auscultation       Cardiovascular negative cardio ROS Normal cardiovascular exam Rhythm:regular Rate:Normal     Neuro/Psych negative neurological ROS  negative psych ROS   GI/Hepatic negative GI ROS, Neg liver ROS,,,  Endo/Other    Morbid obesity  Renal/GU negative Renal ROS  negative genitourinary   Musculoskeletal   Abdominal  (+) + obese  Peds  Hematology  (+) Blood dyscrasia, anemia   Anesthesia Other Findings   Reproductive/Obstetrics (+) Pregnancy                             Anesthesia Physical Anesthesia Plan  ASA: 3  Anesthesia Plan: Epidural   Post-op Pain Management:    Induction:   PONV Risk Score and Plan:   Airway Management Planned:   Additional Equipment:   Intra-op Plan:   Post-operative Plan:   Informed Consent: I have reviewed the patients History and Physical, chart, labs and discussed the procedure including the risks, benefits and alternatives for the proposed anesthesia with the patient or authorized representative who has indicated his/her understanding and acceptance.       Plan Discussed with:   Anesthesia Plan Comments:        Anesthesia Quick Evaluation

## 2023-01-23 NOTE — Progress Notes (Signed)
   Labor Progress Note  Grace Lopez, 22 y.o., G2P0010, with an IUP @ [redacted]w[redacted]d, presented for presenting for induction of labor, elective, at 40 weeks 5 days EGA. LMP 04/14/22 EDC 01/18/23 by first trimester ultrasound. GBS-.  Subjective: Pt in room and comfortable after epidural, in room to assess, pt 5'4" and fetus with leopold's feels about 9 lbs, slight narrow pelvis noted, discussed risk of shoulder dystocia with pt and questions answered. Discussed plan of expectant management, versus IUPC and potential pitocin based of MVU, pt desires expectant management for now, will reassess next check, working on maternal position changes for fetal decent.  Patient Active Problem List   Diagnosis Date Noted   Post-dates pregnancy 01/23/2023   Objective: BP 114/89   Pulse (!) 111   Temp 97.9 F (36.6 C) (Oral)   Resp 16   Ht 5\' 4"  (1.626 m)   Wt 106.6 kg   LMP 04/14/2022   SpO2 100%   BMI 40.34 kg/m  No intake/output data recorded. No intake/output data recorded. NST: FHR baseline 130 bpm, Variability: moderate, Accelerations:present, Decelerations:  Present  occ early = Cat 1/Reactive CTX:  irregular, every 1-4 minutes Uterus gravid, soft non tender, moderate to palpate with contractions.  SVE:  Dilation: 6 Effacement (%): 80 Station: 0 Exam by:: Lynnett Langlinais, CNM Pitocin at 25mUn/min AROM moderate meconium, with + pug sign Suspected LGA 9lbs Pelvis appears narrow.   Assessment:  Grace Lopez, 22 y.o., G2P0010, with an IUP @ [redacted]w[redacted]d, presented for presenting for induction of labor, elective, at 40 weeks 5 days EGA. LMP 04/14/22 EDC 01/18/23 by first trimester ultrasound. GBS-. Patient Active Problem List   Diagnosis Date Noted   Post-dates pregnancy 01/23/2023   NICHD: Category 1  Membranes:  1313 @ 5/20 x and Rupture forebag @ 2016, no s/s of infection  Induction:    Cytotec x5/20/2024 50/55mcg @ 0936  Foley Bulb: N/A  Pitocin - 0  Pain management:               IV pain  management: x PRN  Nitrous: N/A             Epidural placement:  at 1844 on 5/20  GBS Negative   Plan: Continue labor plan Continuous/intermittent monitoring Rest Ambulate Frequent position changes to facilitate fetal rotation and descent. Will reassess with cervical exam at 4 hours or earlier if necessary Anticipate pitocin per protocol if no cervical change in 4 hours Discussed Risk of shoulder dystocia Anticipate IUPC placement next check if no cervical dilation.  Anticipate labor progression and vaginal delivery.   [facility] CNM, FNP-C, PMHNP-BC  [address]  [place], [place] [address]  Cell: [phone]  Office Phone: [phone] Fax: [phone] 01/23/2023  9:12 PM

## 2023-01-23 NOTE — Anesthesia Procedure Notes (Signed)
Epidural Patient location during procedure: OB Start time: 01/23/2023 6:17 PM End time: 01/23/2023 6:27 PM  Staffing Anesthesiologist: Leonides Grills, MD Performed: anesthesiologist   Preanesthetic Checklist Completed: patient identified, IV checked, site marked, risks and benefits discussed, monitors and equipment checked, pre-op evaluation and timeout performed  Epidural Patient position: sitting Prep: DuraPrep Patient monitoring: heart rate, cardiac monitor, continuous pulse ox and blood pressure Approach: midline Location: L3-L4 Injection technique: LOR air  Needle:  Needle type: Tuohy  Needle gauge: 17 G Needle length: 9 cm Needle insertion depth: 8 cm Catheter type: closed end flexible Catheter size: 19 Gauge Catheter at skin depth: 14 cm Test dose: negative and 1.5% lidocaine with Epi 1:200 K  Assessment Events: blood not aspirated, no cerebrospinal fluid, injection not painful, no injection resistance and negative IV test  Additional Notes Informed consent obtained prior to proceeding including risk of failure, 1% risk of PDPH, risk of minor discomfort and bruising. Discussed alternatives to epidural analgesia and patient desires to proceed.  Timeout performed pre-procedure verifying patient name, procedure, and platelet count.  Patient tolerated procedure well. Reason for block:procedure for pain

## 2023-01-23 NOTE — Progress Notes (Signed)
   Labor Progress Note  Grace Lopez, 22 y.o., G2P0010, with an IUP @ [redacted]w[redacted]d, presented for presenting for induction of labor, elective, at 40 weeks 5 days EGA. LMP 04/14/22 EDC 01/18/23 by first trimester ultrasound. GBS-.  Subjective: Pt resting in bed in NAD, pushed her epidural button and feels better, pt progressing in active labor.  Patient Active Problem List   Diagnosis Date Noted   Post-dates pregnancy 01/23/2023   Objective: BP 112/67   Pulse (!) 104   Temp 99.2 F (37.3 C) (Oral)   Resp 16   Ht 5\' 4"  (1.626 m)   Wt 106.6 kg   LMP 04/14/2022   SpO2 100%   BMI 40.34 kg/m  No intake/output data recorded. No intake/output data recorded. NST: FHR baseline 145 bpm, Variability: minimum, Accelerations:present, Decelerations:  Present  occ early = Cat 2/Reactive to scalp stimulation CTX:  irregular, every 2-4 minutes Uterus gravid, soft non tender, moderate to palpate with contractions.  SVE:  Dilation: 9 Effacement (%): 90 Station: Plus 1 Exam by:: Alencia Gordon, CNM Pitocin at 39mUn/min Suspected LGA 9lbs Pelvis appears slightly narrow.   Assessment:  Grace Lopez, 22 y.o., G2P0010, with an IUP @ [redacted]w[redacted]d, presented for presenting for induction of labor, elective, at 40 weeks 5 days EGA. LMP 04/14/22 EDC 01/18/23 by first trimester ultrasound. GBS-. Patient Active Problem List   Diagnosis Date Noted   Post-dates pregnancy 01/23/2023   NICHD: Category 2 with minimum variability. Maternal position changes.   Membranes:  1313 @ 5/20 x and Rupture forebag @ 2016, no s/s of infection  Induction:    Cytotec x5/20/2024 50/17mcg @ 0936  Foley Bulb: N/A  Pitocin - 0  Pain management:               IV pain management: x PRN  Nitrous: N/A             Epidural placement:  at 1844 on 5/20  GBS Negative   Plan: Continue labor plan Continuous monitoring Rest Frequent position changes to facilitate fetal rotation and descent. Will reassess with cervical exam at 1 hours or  earlier if necessary Discussed Risk of shoulder dystocia Anticipate labor progression and vaginal delivery.   [facility] CNM, FNP-C, PMHNP-BC  [id number]  [place], [place] [address]  Cell: [phone]  Office Phone: [phone] Fax: [phone] 01/23/2023  11:42 PM

## 2023-01-23 NOTE — H&P (Addendum)
Grace Lopez is a 22 y.o. female G2P0010 presenting for induction of labor, elective, at 40 weeks 5 days EGA. LMP 04/14/22 EDC 01/18/23 by first trimester ultrasound. Prenatal course has been benign and received from [facility].   OB History     Gravida  2   Para      Term      Preterm      AB  1   Living         SAB  1   IAB      Ectopic      Multiple      Live Births             Past Medical History:  Diagnosis Date   Ovarian cyst    Past Surgical History:  Procedure Laterality Date   NO PAST SURGERIES     Family History: family history includes Asthma in her brother; Diabetes in her maternal grandfather. Social History:  reports that she has never smoked. She has never used smokeless tobacco. She reports that she does not currently use alcohol. She reports that she does not use drugs.     Maternal Diabetes: No Genetic Screening: Normal Maternal Ultrasounds/Referrals: Normal Fetal Ultrasounds or other Referrals:  None Maternal Substance Abuse:  No Significant Maternal Medications:  None Significant Maternal Lab Results:  Group B Strep negative Number of Prenatal Visits:greater than 3 verified prenatal visits Other Comments:  None  Review of Systems History Constitutional: Denies fevers/chills Cardiovascular: Denies chest pain or palpitations Pulmonary: Denies coughing or wheezing Gastrointestinal: Denies nausea, vomiting or diarrhea Genitourinary: Denies pelvic pain, unusual vaginal bleeding, unusual vaginal discharge, dysuria, urgency or frequency.  Musculoskeletal: Denies muscle or joint aches and pain.  Neurology: Denies abnormal sensations such as tingling or numbness.    Blood pressure (!) 109/58, pulse 81, temperature 97.7 F (36.5 C), temperature source Oral, resp. rate 20, height 5\' 4"  (1.626 m), weight 106.6 kg, last menstrual period 04/14/2022. Exam Physical Exam  Constitutional: She is oriented to person, place, and  time. She appears well-developed and well-nourished.  HENT:  Head: Normocephalic and atraumatic.  Neck: Normal range of motion.  Cardiovascular: Normal rate.    Respiratory: Effort normal.   GI: Soft.  Skin: Skin is warm and dry.  Psychiatric: She has a normal mood and affect. Her behavior is normal.   Genitourinary: Gravid uterus, appropriate for gestational age.  EFW 7.5 to 8 lbs.  Baseline: 140, moderate variability, reactive.  TOCO: Irregular contractions.   Prenatal labs: ABO, Rh: --/--/PENDING (05/20 1610) Antibody: PENDING (05/20 0856) Rubella: Immune (10/24 0000) RPR: Nonreactive (10/24 0000)  HBsAg: Negative (10/24 0000)  HIV: Non-reactive (10/24 0000)  GBS: Negative/-- (04/26 0000)   Recent Results (from the past 2160 hour(s))  Urinalysis, Routine w reflex microscopic -Urine, Clean Catch     Status: Abnormal   Collection Time: 11/13/22 10:50 AM  Result Value Ref Range   Color, Urine YELLOW YELLOW   APPearance CLEAR CLEAR   Specific Gravity, Urine 1.010 1.005 - 1.030   pH 5.0 5.0 - 8.0   Glucose, UA NEGATIVE NEGATIVE mg/dL   Hgb urine dipstick NEGATIVE NEGATIVE   Bilirubin Urine NEGATIVE NEGATIVE   Ketones, ur 20 (A) NEGATIVE mg/dL   Protein, ur NEGATIVE NEGATIVE mg/dL   Nitrite NEGATIVE NEGATIVE   Leukocytes,Ua NEGATIVE NEGATIVE    Comment: Performed at [facility], [address], [place], [place] [address]  Resp panel by RT-PCR (RSV, Flu A&B, Covid) Anterior  Nasal Swab     Status: None   Collection Time: 11/13/22 11:19 AM   Specimen: Anterior Nasal Swab  Result Value Ref Range   SARS Coronavirus 2 by RT PCR NEGATIVE NEGATIVE   Influenza A by PCR NEGATIVE NEGATIVE   Influenza B by PCR NEGATIVE NEGATIVE    Comment: (NOTE) The Xpert Xpress SARS-CoV-2/FLU/RSV plus assay is intended as an aid in the diagnosis of influenza from Nasopharyngeal swab specimens and should not be used as a sole basis for treatment. Nasal washings and aspirates are unacceptable  for Xpert Xpress SARS-CoV-2/FLU/RSV testing.  Fact Sheet for Patients: BloggerCourse.com  Fact Sheet for Healthcare Providers: SeriousBroker.it  This test is not yet approved or cleared by the [place] FDA and has been authorized for detection and/or diagnosis of SARS-CoV-2 by FDA under an Emergency Use Authorization (EUA). This EUA will remain in effect (meaning this test can be used) for the duration of the COVID-19 declaration under Section 564(b)(1) of the Act, 21 U.S.C. section 360bbb-3(b)(1), unless the authorization is terminated or revoked.     Resp Syncytial Virus by PCR NEGATIVE NEGATIVE    Comment: (NOTE) Fact Sheet for Patients: BloggerCourse.com  Fact Sheet for Healthcare Providers: SeriousBroker.it  This test is not yet approved or cleared by the [place] FDA and has been authorized for detection and/or diagnosis of SARS-CoV-2 by FDA under an Emergency Use Authorization (EUA). This EUA will remain in effect (meaning this test can be used) for the duration of the COVID-19 declaration under Section 564(b)(1) of the Act, 21 U.S.C. section 360bbb-3(b)(1), unless the authorization is terminated or revoked.  Performed at [facility], [address] [place], [place] [address]   CBC     Status: Abnormal   Collection Time: 11/13/22 12:11 PM  Result Value Ref Range   WBC 4.1 4.0 - 10.5 K/uL   RBC 3.33 (L) 3.87 - 5.11 MIL/uL   Hemoglobin 10.1 (L) 12.0 - 15.0 g/dL   HCT 60.4 (L) 54.0 - 98.1 %   MCV 87.4 80.0 - 100.0 fL   MCH 30.3 26.0 - 34.0 pg   MCHC 34.7 30.0 - 36.0 g/dL   RDW 19.1 47.8 - 29.5 %   Platelets 133 (L) 150 - 400 K/uL    Comment: REPEATED TO VERIFY   nRBC 0.0 0.0 - 0.2 %    Comment: Performed at [facility], [address] [place], [place] [address]  Comprehensive metabolic panel     Status: Abnormal   Collection Time:  11/13/22 12:11 PM  Result Value Ref Range   Sodium 135 135 - 145 mmol/L   Potassium 3.2 (L) 3.5 - 5.1 mmol/L   Chloride 105 98 - 111 mmol/L   CO2 20 (L) 22 - 32 mmol/L   Glucose, Bld 93 70 - 99 mg/dL    Comment: Glucose reference range applies only to samples taken after fasting for at least 8 hours.   BUN 7 6 - 20 mg/dL   Creatinine, Ser 8.65 0.44 - 1.00 mg/dL   Calcium 8.4 (L) 8.9 - 10.3 mg/dL   Total Protein 6.3 (L) 6.5 - 8.1 g/dL   Albumin 2.9 (L) 3.5 - 5.0 g/dL   AST 24 15 - 41 U/L   ALT 19 0 - 44 U/L   Alkaline Phosphatase 43 38 - 126 U/L   Total Bilirubin 0.6 0.3 - 1.2 mg/dL   GFR, Estimated >78 >46 mL/min    Comment: (NOTE) Calculated using the CKD-EPI  Creatinine Equation (2021)    Anion gap 10 5 - 15    Comment: Performed at [facility], [address], [place], [place] [address]  OB RESULT CONSOLE Group B Strep     Status: None   Collection Time: 12/30/22 12:00 AM  Result Value Ref Range   GBS Negative   Type and screen     Status: None (Preliminary result)   Collection Time: 01/23/23  8:56 AM  Result Value Ref Range   ABO/RH(D) PENDING    Antibody Screen PENDING    Sample Expiration      01/26/2023,2359 Performed at [facility], [address], [place], [place] [address]      Assessment/Plan: 22 y/o G2P0010 at 40 weeks 5 days EGA here for elective induction of labor,  - Admit to Labor and Delivery as per admit orders. - I discussed with patient risks, benefits and alternatives of labor induction including risks of cesarean delivery.  We discussed risks of induction agents including effects on fetal heart beat, contraction pattern and need for close monitoring.  Patient expressed understanding of all this and desired to proceed with the induction.    - Will start with buccal and vaginal cytotec.    Prescilla Sours 01/23/2023, 9:26 AM

## 2023-01-24 ENCOUNTER — Encounter (HOSPITAL_COMMUNITY): Payer: Self-pay | Admitting: Obstetrics and Gynecology

## 2023-01-24 ENCOUNTER — Other Ambulatory Visit: Payer: Self-pay

## 2023-01-24 ENCOUNTER — Encounter (HOSPITAL_COMMUNITY)
Admission: RE | Disposition: A | Discharge status: Home / Self Care | Payer: Self-pay | Source: Home / Self Care | Attending: Obstetrics and Gynecology

## 2023-01-24 DIAGNOSIS — Z98891 History of uterine scar from previous surgery: Secondary | ICD-10-CM

## 2023-01-24 DIAGNOSIS — D62 Acute posthemorrhagic anemia: Secondary | ICD-10-CM | POA: Diagnosis not present

## 2023-01-24 LAB — URINALYSIS, ROUTINE W REFLEX MICROSCOPIC
Bilirubin Urine: NEGATIVE
Glucose, UA: NEGATIVE mg/dL
Ketones, ur: 20 mg/dL — AB
Leukocytes,Ua: NEGATIVE
Nitrite: NEGATIVE
Protein, ur: NEGATIVE mg/dL
Specific Gravity, Urine: 1.008 (ref 1.005–1.030)
pH: 6 (ref 5.0–8.0)

## 2023-01-24 SURGERY — Surgical Case
Anesthesia: Epidural

## 2023-01-24 MED ORDER — ACETAMINOPHEN 500 MG PO TABS: 1000.0000 mg | ORAL_TABLET | Freq: Once | ORAL | Status: DC

## 2023-01-24 MED ORDER — MORPHINE SULFATE (PF) 0.5 MG/ML IJ SOLN
INTRAMUSCULAR | Status: DC | PRN
  Administered 2023-01-24: 3 mg via EPIDURAL

## 2023-01-24 MED ORDER — ZOLPIDEM TARTRATE 5 MG PO TABS: 5.0000 mg | ORAL_TABLET | Freq: Every evening | ORAL | Status: DC | PRN

## 2023-01-24 MED ORDER — NALOXONE HCL 4 MG/10ML IJ SOLN: 1.0000 ug/kg/h | INTRAVENOUS | Status: DC | PRN

## 2023-01-24 MED ORDER — TRANEXAMIC ACID-NACL 1000-0.7 MG/100ML-% IV SOLN
INTRAVENOUS | Status: DC | PRN
  Administered 2023-01-24: 1000 mg via INTRAVENOUS

## 2023-01-24 MED ORDER — PHENYLEPHRINE HCL-NACL 20-0.9 MG/250ML-% IV SOLN
INTRAVENOUS | Status: DC | PRN
  Administered 2023-01-24: 80 ug via INTRAVENOUS
  Administered 2023-01-24: 160 ug via INTRAVENOUS
  Administered 2023-01-24: 60 ug/min via INTRAVENOUS
  Administered 2023-01-24: 80 ug via INTRAVENOUS

## 2023-01-24 MED ORDER — KETOROLAC TROMETHAMINE 30 MG/ML IJ SOLN: 30.0000 mg | Freq: Four times a day (QID) | INTRAMUSCULAR | Status: AC | PRN

## 2023-01-24 MED ORDER — DEXAMETHASONE SODIUM PHOSPHATE 10 MG/ML IJ SOLN
INTRAMUSCULAR | Status: DC | PRN
  Administered 2023-01-24: 10 mg via INTRAVENOUS

## 2023-01-24 MED ORDER — SODIUM CHLORIDE 0.9 % IR SOLN
Status: DC | PRN
  Administered 2023-01-24: 1000 mL

## 2023-01-24 MED ORDER — SODIUM CHLORIDE 0.9 % IV SOLN
3.0000 g | Freq: Four times a day (QID) | INTRAVENOUS | Status: DC
  Filled 2023-01-24 (×3): qty 8

## 2023-01-24 MED ORDER — SOD CITRATE-CITRIC ACID 500-334 MG/5ML PO SOLN
30.0000 mL | ORAL | Status: AC
  Administered 2023-01-24: 30 mL via ORAL
  Filled 2023-01-24: qty 30

## 2023-01-24 MED ORDER — DIPHENHYDRAMINE HCL 50 MG/ML IJ SOLN: 12.5000 mg | INTRAMUSCULAR | Status: DC | PRN

## 2023-01-24 MED ORDER — LIDOCAINE-EPINEPHRINE (PF) 2 %-1:200000 IJ SOLN
INTRAMUSCULAR | Status: DC | PRN
  Administered 2023-01-24: 2 mL via EPIDURAL
  Administered 2023-01-24: 5 mL via EPIDURAL
  Administered 2023-01-24: 3 mL via EPIDURAL
  Administered 2023-01-24: 5 mL via EPIDURAL

## 2023-01-24 MED ORDER — ACETAMINOPHEN 500 MG PO TABS
1000.0000 mg | ORAL_TABLET | Freq: Four times a day (QID) | ORAL | Status: DC
  Administered 2023-01-24 – 2023-01-26 (×8): 1000 mg via ORAL
  Filled 2023-01-24 (×8): qty 2

## 2023-01-24 MED ORDER — IBUPROFEN 600 MG PO TABS
600.0000 mg | ORAL_TABLET | Freq: Four times a day (QID) | ORAL | Status: DC
  Administered 2023-01-25 – 2023-01-26 (×4): 600 mg via ORAL
  Filled 2023-01-24 (×4): qty 1

## 2023-01-24 MED ORDER — DIBUCAINE (PERIANAL) 1 % EX OINT: 1.0000 | TOPICAL_OINTMENT | CUTANEOUS | Status: DC | PRN

## 2023-01-24 MED ORDER — STERILE WATER FOR IRRIGATION IR SOLN
Status: DC | PRN
  Administered 2023-01-24: 1000 mL

## 2023-01-24 MED ORDER — SIMETHICONE 80 MG PO CHEW: 80.0000 mg | CHEWABLE_TABLET | ORAL | Status: DC | PRN

## 2023-01-24 MED ORDER — CEFAZOLIN SODIUM-DEXTROSE 2-4 GM/100ML-% IV SOLN
2.0000 g | Freq: Once | INTRAVENOUS | Status: AC
  Administered 2023-01-24: 2 g via INTRAVENOUS
  Filled 2023-01-24 (×2): qty 100

## 2023-01-24 MED ORDER — OXYTOCIN-SODIUM CHLORIDE 30-0.9 UT/500ML-% IV SOLN
INTRAVENOUS | Status: DC | PRN
  Administered 2023-01-24: 300 mL via INTRAVENOUS
  Administered 2023-01-24: 200 mL via INTRAVENOUS

## 2023-01-24 MED ORDER — DIPHENHYDRAMINE HCL 25 MG PO CAPS: 25.0000 mg | ORAL_CAPSULE | ORAL | Status: DC | PRN

## 2023-01-24 MED ORDER — OXYTOCIN-SODIUM CHLORIDE 30-0.9 UT/500ML-% IV SOLN
2.5000 [IU]/h | INTRAVENOUS | Status: AC
  Administered 2023-01-24: 2.5 [IU]/h via INTRAVENOUS
  Filled 2023-01-24: qty 500

## 2023-01-24 MED ORDER — FENTANYL CITRATE (PF) 100 MCG/2ML IJ SOLN
INTRAMUSCULAR | Status: DC | PRN
  Administered 2023-01-24: 100 ug via EPIDURAL

## 2023-01-24 MED ORDER — DIPHENHYDRAMINE HCL 25 MG PO CAPS: 25.0000 mg | ORAL_CAPSULE | Freq: Four times a day (QID) | ORAL | Status: DC | PRN

## 2023-01-24 MED ORDER — KETOROLAC TROMETHAMINE 30 MG/ML IJ SOLN
30.0000 mg | Freq: Once | INTRAMUSCULAR | Status: AC
  Administered 2023-01-24: 30 mg via INTRAVENOUS

## 2023-01-24 MED ORDER — SODIUM CHLORIDE 0.9% FLUSH: 3.0000 mL | INTRAVENOUS | Status: DC | PRN

## 2023-01-24 MED ORDER — ACETAMINOPHEN 160 MG/5ML PO SOLN: 1000.0000 mg | Freq: Once | ORAL | Status: DC

## 2023-01-24 MED ORDER — SODIUM CHLORIDE 0.9 % IV SOLN
INTRAVENOUS | Status: DC | PRN
  Administered 2023-01-24: 500 mg via INTRAVENOUS

## 2023-01-24 MED ORDER — KETOROLAC TROMETHAMINE 30 MG/ML IJ SOLN
INTRAMUSCULAR | Status: AC
  Filled 2023-01-24: qty 1

## 2023-01-24 MED ORDER — MENTHOL 3 MG MT LOZG: 1.0000 | LOZENGE | OROMUCOSAL | Status: DC | PRN

## 2023-01-24 MED ORDER — ACETAMINOPHEN 500 MG PO TABS
1000.0000 mg | ORAL_TABLET | Freq: Four times a day (QID) | ORAL | Status: DC
Start: 2023-01-24 — End: 2023-01-24

## 2023-01-24 MED ORDER — OXYCODONE HCL 5 MG PO TABS: 5.0000 mg | ORAL_TABLET | ORAL | Status: DC | PRN

## 2023-01-24 MED ORDER — KETOROLAC TROMETHAMINE 30 MG/ML IJ SOLN
30.0000 mg | Freq: Four times a day (QID) | INTRAMUSCULAR | Status: AC | PRN
  Administered 2023-01-24: 30 mg via INTRAVENOUS

## 2023-01-24 MED ORDER — DROPERIDOL 2.5 MG/ML IJ SOLN: 0.6250 mg | Freq: Once | INTRAMUSCULAR | Status: DC | PRN

## 2023-01-24 MED ORDER — CEFAZOLIN SODIUM-DEXTROSE 2-3 GM-%(50ML) IV SOLR
INTRAVENOUS | Status: DC | PRN
  Administered 2023-01-24: 2 g via INTRAVENOUS

## 2023-01-24 MED ORDER — ONDANSETRON HCL 4 MG/2ML IJ SOLN
INTRAMUSCULAR | Status: DC | PRN
  Administered 2023-01-24: 4 mg via INTRAVENOUS

## 2023-01-24 MED ORDER — FENTANYL CITRATE (PF) 100 MCG/2ML IJ SOLN
INTRAMUSCULAR | Status: AC
  Filled 2023-01-24: qty 2

## 2023-01-24 MED ORDER — FENTANYL CITRATE (PF) 100 MCG/2ML IJ SOLN: 50.0000 ug | INTRAMUSCULAR | Status: DC | PRN

## 2023-01-24 MED ORDER — NALOXONE HCL 0.4 MG/ML IJ SOLN: 0.4000 mg | INTRAMUSCULAR | Status: DC | PRN

## 2023-01-24 MED ORDER — MORPHINE SULFATE (PF) 0.5 MG/ML IJ SOLN
INTRAMUSCULAR | Status: AC
  Filled 2023-01-24: qty 10

## 2023-01-24 MED ORDER — COCONUT OIL OIL: 1.0000 | TOPICAL_OIL | Status: DC | PRN

## 2023-01-24 MED ORDER — CEFAZOLIN SODIUM-DEXTROSE 2-4 GM/100ML-% IV SOLN: 2.0000 g | INTRAVENOUS | Status: DC

## 2023-01-24 MED ORDER — SIMETHICONE 80 MG PO CHEW
80.0000 mg | CHEWABLE_TABLET | Freq: Three times a day (TID) | ORAL | Status: DC
  Administered 2023-01-24 – 2023-01-26 (×6): 80 mg via ORAL
  Filled 2023-01-24 (×6): qty 1

## 2023-01-24 MED ORDER — LACTATED RINGERS IV SOLN: INTRAVENOUS | Status: DC | PRN

## 2023-01-24 MED ORDER — SENNOSIDES-DOCUSATE SODIUM 8.6-50 MG PO TABS
2.0000 | ORAL_TABLET | Freq: Every day | ORAL | Status: DC
  Administered 2023-01-25 – 2023-01-26 (×2): 2 via ORAL
  Filled 2023-01-24 (×2): qty 2

## 2023-01-24 MED ORDER — ACETAMINOPHEN 10 MG/ML IV SOLN
INTRAVENOUS | Status: DC | PRN
  Administered 2023-01-24: 1000 mg via INTRAVENOUS

## 2023-01-24 MED ORDER — SODIUM CHLORIDE 0.9 % IV SOLN: 500.0000 mg | INTRAVENOUS | Status: DC

## 2023-01-24 MED ORDER — FERROUS SULFATE 325 (65 FE) MG PO TABS
325.0000 mg | ORAL_TABLET | ORAL | Status: DC
  Administered 2023-01-26: 325 mg via ORAL
  Filled 2023-01-24: qty 1

## 2023-01-24 MED ORDER — ONDANSETRON HCL 4 MG/2ML IJ SOLN: 4.0000 mg | Freq: Three times a day (TID) | INTRAMUSCULAR | Status: DC | PRN

## 2023-01-24 MED ORDER — FENTANYL CITRATE (PF) 100 MCG/2ML IJ SOLN: 25.0000 ug | INTRAMUSCULAR | Status: DC | PRN

## 2023-01-24 MED ORDER — PRENATAL MULTIVITAMIN CH
1.0000 | ORAL_TABLET | Freq: Every day | ORAL | Status: DC
  Administered 2023-01-25 – 2023-01-26 (×2): 1 via ORAL
  Filled 2023-01-24 (×2): qty 1

## 2023-01-24 MED ORDER — WITCH HAZEL-GLYCERIN EX PADS: 1.0000 | MEDICATED_PAD | CUTANEOUS | Status: DC | PRN

## 2023-01-24 MED ORDER — KETOROLAC TROMETHAMINE 30 MG/ML IJ SOLN
30.0000 mg | Freq: Four times a day (QID) | INTRAMUSCULAR | Status: AC
  Administered 2023-01-24 – 2023-01-25 (×3): 30 mg via INTRAVENOUS
  Filled 2023-01-24 (×4): qty 1

## 2023-01-24 SURGICAL SUPPLY — 37 items
BARRIER ADHS 3X4 INTERCEED (GAUZE/BANDAGES/DRESSINGS) IMPLANT
BENZOIN TINCTURE PRP APPL 2/3 (GAUZE/BANDAGES/DRESSINGS) ×1 IMPLANT
CHLORAPREP W/TINT 26 (MISCELLANEOUS) ×2 IMPLANT
CLAMP UMBILICAL CORD (MISCELLANEOUS) ×1 IMPLANT
CLOTH BEACON ORANGE TIMEOUT ST (SAFETY) ×1 IMPLANT
DRSG OPSITE POSTOP 4X10 (GAUZE/BANDAGES/DRESSINGS) ×1 IMPLANT
ELECT REM PT RETURN 9FT ADLT (ELECTROSURGICAL) ×1
ELECTRODE REM PT RTRN 9FT ADLT (ELECTROSURGICAL) ×1 IMPLANT
EXTRACTOR VACUUM M CUP 4 TUBE (SUCTIONS) IMPLANT
GAUZE SPONGE 4X4 12PLY STRL LF (GAUZE/BANDAGES/DRESSINGS) IMPLANT
GLOVE BIO SURGEON STRL SZ7.5 (GLOVE) ×1 IMPLANT
GLOVE BIOGEL PI IND STRL 7.0 (GLOVE) ×1 IMPLANT
GLOVE BIOGEL PI IND STRL 7.5 (GLOVE) ×1 IMPLANT
GOWN STRL REUS W/TWL LRG LVL3 (GOWN DISPOSABLE) ×2 IMPLANT
KIT ABG SYR 3ML LUER SLIP (SYRINGE) IMPLANT
NDL HYPO 25X5/8 SAFETYGLIDE (NEEDLE) IMPLANT
NEEDLE HYPO 25X5/8 SAFETYGLIDE (NEEDLE) IMPLANT
NS IRRIG 1000ML POUR BTL (IV SOLUTION) ×1 IMPLANT
PACK C SECTION WH (CUSTOM PROCEDURE TRAY) ×1 IMPLANT
PAD OB MATERNITY 4.3X12.25 (PERSONAL CARE ITEMS) ×1 IMPLANT
RTRCTR C-SECT PINK 25CM LRG (MISCELLANEOUS) ×1 IMPLANT
SPONGE T-LAP 18X18 ~~LOC~~+RFID (SPONGE) IMPLANT
STRIP CLOSURE SKIN 1/2X4 (GAUZE/BANDAGES/DRESSINGS) ×1 IMPLANT
SUT CHROMIC 2 0 CT 1 (SUTURE) ×1 IMPLANT
SUT MNCRL 0 VIOLET CTX 36 (SUTURE) ×1 IMPLANT
SUT MNCRL AB 3-0 PS2 27 (SUTURE) ×1 IMPLANT
SUT PLAIN 2 0 XLH (SUTURE) ×1 IMPLANT
SUT VIC AB 0 CT1 36 (SUTURE) ×1 IMPLANT
SUT VIC AB 0 CTX 36 (SUTURE) ×4
SUT VIC AB 0 CTX36XBRD ANBCTRL (SUTURE) ×3 IMPLANT
SUT VIC AB 2-0 CT1 27 (SUTURE) ×1
SUT VIC AB 2-0 CT1 TAPERPNT 27 (SUTURE) IMPLANT
SUT VIC AB 2-0 SH 27 (SUTURE)
SUT VIC AB 2-0 SH 27XBRD (SUTURE) IMPLANT
TOWEL OR 17X24 6PK STRL BLUE (TOWEL DISPOSABLE) ×1 IMPLANT
TRAY FOLEY W/BAG SLVR 14FR LF (SET/KITS/TRAYS/PACK) ×1 IMPLANT
WATER STERILE IRR 1000ML POUR (IV SOLUTION) ×1 IMPLANT

## 2023-01-24 NOTE — Lactation Note (Addendum)
This note was copied from a baby's chart. Lactation Consultation Note  Patient Name: Girl Averlee Redfern ZOXWR'U Date: 01/24/2023 Age:24 hours  Attempted to see mom. Mom stated she would like to try to BF but not today. Mom stated she had a PPH and was very weak feeling. Mom stated she is giving the baby formula today and tonight. Mom would like to try to BF tomorrow. Mom didn't want to be set up w/a pump tonight, she is tired and wants to rest. LC answered the questions that mom had about BF and BM.  Maternal Data    Feeding Nipple Type: Extra Slow Flow  LATCH Score                    Lactation Tools Discussed/Used    Interventions    Discharge    Consult Status      Charyl Dancer 01/24/2023, 9:15 PM

## 2023-01-24 NOTE — Progress Notes (Signed)
Labor Progress Note  Grace Lopez, 22 y.o., G2P0010, with an IUP @ [redacted]w[redacted]d, presented for presenting for induction of labor, elective, at 40 weeks 5 days EGA. LMP 04/14/22 EDC 01/18/23 by first trimester ultrasound. GBS-.  Subjective: Pt total pushed for 30 min around 0100 then labored down for 2 hours for a nap r/t maternal exhaustion woke and pushed for another 2 hours then requested to labor down again and fell asleep for 1 hour, pitocin was increased to 4 and MVU have been adequate at 200, minor decent was noted but caput noted at +2, cat 1 strip noted at this time, discussed in length with pt about shoulder dystocia, PCS, and vacuume, discussed increased risk of SD with vacuum, I recommended PCS with risk discussed of bleeding, damage to internal organs, infection. Patient was consented for blood products.  The patient is aware that bleeding may result in the need for a blood transfusion which includes risk of transmission of HIV (1:2 million), Hepatitis C (1:2 million), and Hepatitis B (1:200 thousand) and transfusion reaction.  Patient voiced understanding of the above risks as well as understanding of indications for blood transfusion.  Questions answered and pt allowed time to process, plan to either push for 1.5 more ours then reassess fi no decent or go for [facility], pt verbalized desires to proceed with PCS. Dr Su Hilt aware and en route for [facility] for failure to decent @ 0830. Patient Active Problem List   Diagnosis Date Noted   Post-dates pregnancy 01/23/2023   Objective: BP 129/88   Pulse (!) 109   Temp 99.6 F (37.6 C) (Axillary)   Resp 18   Ht 5\' 4"  (1.626 m)   Wt 106.6 kg   LMP 04/14/2022   SpO2 100%   BMI 40.34 kg/m  No intake/output data recorded. No intake/output data recorded. NST: FHR baseline 125 bpm, Variability: minimum, Accelerations:present, Decelerations:  Present 3 late's noted which resolved with maternal position change earlier. = Cat 1/Reactive to scalp  stimulation CTX:  irregular, every 2-4 minutes Uterus gravid, soft non tender, moderate to palpate with contractions.  SVE:  Dilation: 10 Effacement (%): 100 Station: Plus 3 Exam by:: Reannah Totten, CNm Pitocin at 4 and turned offmUn/min Suspected LGA 9lbs IUPC in place. 200  Assessment:  Grace Lopez, 22 y.o., G2P0010, with an IUP @ [redacted]w[redacted]d, presented for presenting for induction of labor, elective, at 40 weeks 5 days EGA. LMP 04/14/22 EDC 01/18/23 by first trimester ultrasound. GBS-. Complete dilation and laboring down.  Patient Active Problem List   Diagnosis Date Noted   Post-dates pregnancy 01/23/2023   NICHD: Category 1. Maternal position changes.   Membranes:  1313 @ 5/20 x and Rupture forebag @ 2016, no s/s of infection  IUPC: MVU 200  Induction:    Cytotec x5/20/2024 50/76mcg @ 0936  Foley Bulb: N/A  Pitocin - 4 then turn off  Pain management:               IV pain management: x PRN  Nitrous: N/A             Epidural placement:  at 1844 on 5/20  GBS Negative   Plan: Proceed with PCS for failure to decent Turn pitocin off Bicitra SCD Foley Clip site Azith/Ancef PCS schedule for 0830am   DR Su Hilt en route to the hospital to proceed with PCS>   [facility] CNM, FNP-C, PMHNP-BC  [address]  [place], [place] [address]  Cell: [phone]  Office Phone: [phone]  Fax: [phone] 01/24/2023  7:42 AM

## 2023-01-24 NOTE — Anesthesia Postprocedure Evaluation (Signed)
Anesthesia Post Note  Patient: Production manager  Procedure(s) Performed: CESAREAN SECTION     Patient location during evaluation: PACU Anesthesia Type: Epidural Level of consciousness: awake and alert Pain management: pain level controlled Vital Signs Assessment: post-procedure vital signs reviewed and stable Respiratory status: spontaneous breathing, nonlabored ventilation and respiratory function stable Cardiovascular status: blood pressure returned to baseline Postop Assessment: epidural receding, no apparent nausea or vomiting, no headache and no backache Anesthetic complications: no   No notable events documented.  Last Vitals:  Vitals:   01/24/23 1220 01/24/23 1320  BP: 107/68 115/62  Pulse: 100 82  Resp: 18   Temp: 37.1 C 36.9 C  SpO2: 100% 99%    Last Pain:  Vitals:   01/24/23 1320  TempSrc: Oral  PainSc:    Pain Goal:                   Shanda Howells

## 2023-01-24 NOTE — Lactation Note (Signed)
This note was copied from a baby's chart. Lactation Consultation Note  Patient Name: Grace Lopez ZOXWR'U Date: 01/24/2023 Age:24 hours Reason for consult: Initial assessment;Primapara;Term RN called stating baby is crying and rooting. Mom would like to try to BF. LC came and latched baby in football hold. Not swallows heard but breast was significantly softer and baby taking less rest feeding pauses while feeding. Baby cried after removed from breast. Mom slept through the last 15 minutes of feeding. Mom is very tired. LC stayed at breast w/baby while feeding. Baby screaming when removed, feeding was slower and baby appeared to be sleeping. LC gave formula. Baby acts as if she is starving. Baby took 30 ml. Gave baby to dad to hold d/t she didn't want to be put in bassinet. Dad stated next feeding he was going to formula feed baby so mom could rest.  Maternal Data Has patient been taught Hand Expression?: Yes Does the patient have breastfeeding experience prior to this delivery?: No  Feeding Mother's Current Feeding Choice: (P) Breast Milk and Formula Nipple Type: Slow - flow  LATCH Score Latch: Grasps breast easily, tongue down, lips flanged, rhythmical sucking.  Audible Swallowing: None (breast soft after BF)  Type of Nipple: Everted at rest and after stimulation  Comfort (Breast/Nipple): Soft / non-tender  Hold (Positioning): Full assist, staff holds infant at breast  LATCH Score: 6   Lactation Tools Discussed/Used    Interventions Interventions: Breast feeding basics reviewed;Assisted with latch;Skin to skin;Breast massage;Hand express;Breast compression;Adjust position;Support pillows;Position options;LC Services brochure  Discharge    Consult Status Consult Status: Follow-up Date: 01/25/23 Follow-up type: In-patient    Charyl Dancer 01/24/2023, 10:43 PM

## 2023-01-24 NOTE — Transfer of Care (Addendum)
Immediate Anesthesia Transfer of Care Note  Patient: Grace Lopez  Procedure(s) Performed: CESAREAN SECTION  Patient Location: PACU  Anesthesia Type:Epidural  Level of Consciousness: awake, alert , and oriented  Airway & Oxygen Therapy: Patient Spontanous Breathing  Post-op Assessment: Report given to RN and Post -op Vital signs reviewed and stable  Post vital signs: Reviewed and stable  Last Vitals:  Vitals Value Taken Time  BP 102/62 01/24/23 1033  Temp    Pulse 124 01/24/23 1040  Resp 21 01/24/23 1040  SpO2 100 % 01/24/23 1040  Vitals shown include unvalidated device data.  Last Pain:  Vitals:   01/24/23 0800  TempSrc: Axillary  PainSc:          Complications: No notable events documented.

## 2023-01-24 NOTE — Lactation Note (Signed)
This note was copied from a baby's chart. Lactation Consultation Note  Patient Name: Grace Lopez ZOXWR'U Date: 01/24/2023 Age:24 hours  LC attempted to visit with the birth parent but she was tired. She asked if lactation could come back later.     Feeding Nipple Type: Extra Slow Flow   Dravin Lance P Sidda Humm 01/24/2023, 2:00 PM

## 2023-01-24 NOTE — Op Note (Signed)
Cesarean Section Procedure Note  Indications: P0 at 40 6/7wks undergoing IOL for postdates and now with FTD.  Pre-operative Diagnosis: failure to descend, primary cesarean section   Post-operative Diagnosis: failure to descend, primary cesarean section, LGA  Procedure: Primary Low Transverse Cesarean Section  Surgeon: Osborn Coho, MD    Assistants: Dale Osakis, CNM  Anesthesia: Regional  Anesthesiologist: Kaylyn Layer, MD   Procedure Details  The patient was taken to the operating room secondary to FTD after the risks, benefits, complications, treatment options, and expected outcomes were discussed with the patient.  The patient concurred with the proposed plan, giving informed consent which was signed and witnessed. The patient was taken to Operating Room A, identified as Grace Lopez and the procedure verified as C-Section Delivery. A Time Out was held and the above information confirmed.  After induction of anesthesia by obtaining a spinal, the patient was prepped and draped in the usual sterile manner. A Pfannenstiel skin incision was made and carried down through the subcutaneous tissue to the underlying layer of fascia.  The fascia was incised bilaterally and extended transversely bilaterally with the Mayo scissors. Kocher clamps were placed on the inferior aspect of the fascial incision and the underlying rectus muscle was separated from the fascia. The same was done on the superior aspect of the fascial incision.  The peritoneum was identified, entered bluntly and extended manually.  An Alexis self-retaining retractor was placed.  The utero-vesical peritoneal reflection was incised transversely and the bladder flap was bluntly freed from the lower uterine segment. A low transverse uterine incision was made with the scalpel and extended bilaterally with the bandage scissors.  The infant was delivered in vertex position with additional help from below and the assistance of the  vacuum uses per usual fashion without difficulty.  After the umbilical cord was clamped and cut, the infant was handed to the awaiting pediatricians.  Cord blood was obtained for evaluation.  The placenta was removed intact and appeared to be within normal limits. The uterus was cleared of all clots and debris. The patient had some atony and a left uterine extension that was repaired with 0 vicryl.  1g of tranexamic acid was given.  The primary uterine incision was closed with running interlocking sutures of 0 Vicryl and a second imbricating layer was performed on the primary incision as well as the uterine extension.  Bilateral tubes and ovaries appeared to be within normal limits.  Good hemostasis was noted.  Copious irrigation was performed until clear.  The peritoneum was repaired with 2-0 chromic via a running suture.  The fascia was reapproximated with a running suture of 0 Vicryl. The subcutaneous tissue was reapproximated with 3 interrupted sutures of 2-0 plain.  The skin was reapproximated with a subcuticular suture of 3-0 monocryl.  Steristrips were applied.  Instrument, sponge, and needle counts were correct prior to abdominal closure and at the conclusion of the case.  The patient was awaiting transfer to the recovery room in good condition.  Findings: Live female infant with Apgars 9 at one minute and 9 at five minutes.  Normal appearing bilateral ovaries and fallopian tubes were noted.  Estimated Blood Loss:  2080 ml         Drains: foley to gravity 100cc blood tinged at the start of the case and clearing by the end.         Total IV Fluids: 2500 ml         Specimens to Pathology: Placenta  Complications:  None; patient tolerated the procedure well.         Disposition: PACU - hemodynamically stable.         Condition: stable  Attending Attestation: I performed the procedure.  I was present and scrubbed and the assistant was required due to complexity of anatomy.

## 2023-01-24 NOTE — Progress Notes (Signed)
   Labor Progress Note  Grace Lopez, 22 y.o., G2P0010, with an IUP @ [redacted]w[redacted]d, presented for presenting for induction of labor, elective, at 40 weeks 5 days EGA. LMP 04/14/22 EDC 01/18/23 by first trimester ultrasound. GBS-.  Subjective: Pt was noted to be complete, trial of push from station +1 was performed for , pt appeared exhausted, and discussed laboring down for pt to take nap, starting pitocin and placing IUPC to monitor MVU, pt effort poor, no fetal decent noted, reviewed R/B/A of both IUPC and pitocin , pt concerned to start pitocin and have IUPC placed, will labor down for 2 hours, then resume pushing with RN.  Patient Active Problem List   Diagnosis Date Noted   Post-dates pregnancy 01/23/2023   Objective: BP 120/78   Pulse 100   Temp 99.5 F (37.5 C) (Axillary)   Resp 18   Ht 5\' 4"  (1.626 m)   Wt 106.6 kg   LMP 04/14/2022   SpO2 100%   BMI 40.34 kg/m  No intake/output data recorded. No intake/output data recorded. NST: FHR baseline 145 bpm, Variability: minimum, Accelerations:present, Decelerations:  Present 3 late's noted which resolved with maternal position change earlier. = Cat 1/Reactive to scalp stimulation CTX:  irregular, every 2-4 minutes Uterus gravid, soft non tender, moderate to palpate with contractions.  SVE:  Dilation: 10 Effacement (%): 100 Station: Plus 1 Exam by:: Vasil Juhasz, CNm Pitocin at 42mUn/min Suspected LGA 9lbs IUPC in place.   Assessment:  Grace Lopez, 22 y.o., G2P0010, with an IUP @ [redacted]w[redacted]d, presented for presenting for induction of labor, elective, at 40 weeks 5 days EGA. LMP 04/14/22 EDC 01/18/23 by first trimester ultrasound. GBS-. Complete dilation and laboring down.  Patient Active Problem List   Diagnosis Date Noted   Post-dates pregnancy 01/23/2023   NICHD: Category 1. Maternal position changes.   Membranes:  1313 @ 5/20 x and Rupture forebag @ 2016, no s/s of infection  IUPC: MVU 200  Induction:    Cytotec x5/20/2024  50/46mcg @ 0936  Foley Bulb: N/A  Pitocin - 2  Pain management:               IV pain management: x PRN  Nitrous: N/A             Epidural placement:  at 1844 on 5/20  GBS Negative   Plan: Continue labor plan Continuous monitoring Rest Frequent position changes to facilitate fetal rotation and descent. Labor down for rest 2 hours then RN to resume pushing Discussed Risk of shoulder dystocia Start pitocin at 2 and titrate to MVU 200 Anticipate labor progression and vaginal delivery.   [facility] CNM, FNP-C, PMHNP-BC  [address]  [place], [place] [address]  Cell: [phone]  Office Phone: [phone] Fax: [phone] 01/24/2023  1:26 AM

## 2023-01-25 ENCOUNTER — Inpatient Hospital Stay (HOSPITAL_COMMUNITY): Payer: BC Managed Care – PPO

## 2023-01-25 LAB — COMPREHENSIVE METABOLIC PANEL
ALT: 10 U/L (ref 0–44)
AST: 22 U/L (ref 15–41)
Albumin: 2 g/dL — ABNORMAL LOW (ref 3.5–5.0)
Alkaline Phosphatase: 54 U/L (ref 38–126)
Anion gap: 12 (ref 5–15)
BUN: 12 mg/dL (ref 6–20)
CO2: 21 mmol/L — ABNORMAL LOW (ref 22–32)
Calcium: 8 mg/dL — ABNORMAL LOW (ref 8.9–10.3)
Chloride: 102 mmol/L (ref 98–111)
Creatinine, Ser: 0.77 mg/dL (ref 0.44–1.00)
GFR, Estimated: >60 mL/min (ref >60)
Glucose, Bld: 104 mg/dL — ABNORMAL HIGH (ref 70–99)
Potassium: 3.6 mmol/L (ref 3.5–5.1)
Sodium: 135 mmol/L (ref 135–145)
Total Bilirubin: 0.5 mg/dL (ref 0.3–1.2)
Total Protein: 4.5 g/dL — ABNORMAL LOW (ref 6.5–8.1)

## 2023-01-25 LAB — CBC
HCT: 17.9 % — ABNORMAL LOW (ref 36.0–46.0)
Hemoglobin: 6 g/dL — CL (ref 12.0–15.0)
MCH: 29.6 pg (ref 26.0–34.0)
MCHC: 33.5 g/dL (ref 30.0–36.0)
MCV: 88.2 fL (ref 80.0–100.0)
Platelets: 136 10*3/uL — ABNORMAL LOW (ref 150–400)
RBC: 2.03 MIL/uL — ABNORMAL LOW (ref 3.87–5.11)
RDW: 14 % (ref 11.5–15.5)
WBC: 13.5 10*3/uL — ABNORMAL HIGH (ref 4.0–10.5)
nRBC: 0 % (ref 0.0–0.2)

## 2023-01-25 LAB — SURGICAL PATHOLOGY

## 2023-01-25 MED ORDER — SODIUM CHLORIDE 0.9 % IV SOLN
500.0000 mg | Freq: Once | INTRAVENOUS | Status: AC
  Administered 2023-01-25: 500 mg via INTRAVENOUS
  Filled 2023-01-25: qty 25

## 2023-01-25 MED ORDER — SODIUM CHLORIDE 0.9 % IV SOLN: 300.0000 mg | Freq: Once | INTRAVENOUS | Status: DC

## 2023-01-25 MED ORDER — RHO D IMMUNE GLOBULIN 1500 UNIT/2ML IJ SOSY
300.0000 ug | PREFILLED_SYRINGE | Freq: Once | INTRAMUSCULAR | Status: AC
  Administered 2023-01-25: 300 ug via INTRAVENOUS
  Filled 2023-01-25: qty 2

## 2023-01-25 NOTE — Progress Notes (Signed)
Pt output decrease and still amber in color, provider notified, no new order given Care on going.

## 2023-01-25 NOTE — Progress Notes (Signed)
Subjective: Postpartum Day 1: Cesarean Delivery Patient reports tolerating PO, + flatus, + BM and no problems voiding.  She denies any dizziness or CP  Objective: Vital signs in last 24 hours: Temp:  [98.2 F (36.8 C)-98.7 F (37.1 C)] 98.2 F (36.8 C) (05/22 0525) Pulse Rate:  [84-99] 84 (05/22 0525) Resp:  [17-18] 18 (05/22 0525) BP: (102-128)/(54-73) 104/54 (05/22 0525) SpO2:  [98 %-99 %] 98 % (05/22 0525)  Physical Exam:  General: alert Lochia: appropriate Uterine Fundus: firm Incision: healing well, no significant drainage, no dehiscence DVT Evaluation: No evidence of DVT seen on physical exam.  Recent Labs    01/23/23 0856 01/25/23 0553  HGB 10.5* 6.0*  HCT 32.2* 17.9*    Assessment/Plan: Status post Cesarean section. Doing well postoperatively.  Anemic but asymptomatic will give iv iron and blood if she becomes symptomatic.   Continue care Dc foley  Thayer Inabinet A Kaleen Rochette 01/25/2023, 1:38 PM

## 2023-01-25 NOTE — Progress Notes (Signed)
Notified provider unable to put in order for type and cross, provider informed on coming provider will order.

## 2023-01-26 ENCOUNTER — Other Ambulatory Visit (HOSPITAL_COMMUNITY): Payer: Self-pay

## 2023-01-26 LAB — RH IG WORKUP (INCLUDES ABO/RH)
Fetal Screen: NEGATIVE
Gestational Age(Wks): 40.6
Unit division: 0

## 2023-01-26 LAB — TYPE AND SCREEN

## 2023-01-26 LAB — BPAM RBC

## 2023-01-26 LAB — CBC
HCT: 23.8 % — ABNORMAL LOW (ref 36.0–46.0)
Hemoglobin: 7.8 g/dL — ABNORMAL LOW (ref 12.0–15.0)
MCH: 27.1 pg (ref 26.0–34.0)
MCHC: 32.8 g/dL (ref 30.0–36.0)
MCV: 82.6 fL (ref 80.0–100.0)
Platelets: 180 K/uL (ref 150–400)
RBC: 2.88 MIL/uL — ABNORMAL LOW (ref 3.87–5.11)
RDW: 17.3 % — ABNORMAL HIGH (ref 11.5–15.5)
WBC: 10.2 K/uL (ref 4.0–10.5)
nRBC: 0 % (ref 0.0–0.2)

## 2023-01-26 LAB — PREPARE RBC (CROSSMATCH)

## 2023-01-26 MED ORDER — SODIUM CHLORIDE 0.9% IV SOLUTION: Freq: Once | INTRAVENOUS | Status: DC

## 2023-01-26 MED ORDER — OXYCODONE HCL 5 MG PO TABS
5.0000 mg | ORAL_TABLET | ORAL | 0 refills | Status: AC | PRN
  Filled 2023-01-26: qty 30, 5d supply, fill #0

## 2023-01-26 MED ORDER — IBUPROFEN 600 MG PO TABS
600.0000 mg | ORAL_TABLET | Freq: Four times a day (QID) | ORAL | 0 refills | Status: AC
  Filled 2023-01-26: qty 30, 8d supply, fill #0

## 2023-01-26 NOTE — Discharge Instructions (Signed)
  [facility], 1. While at home remember to walk regularly, at least 1 hour a day, as this will help with your quick recovery. 2. Do not do any heavy lifting, i.e nothing heavier than 15 lbs for the next 6 weeks 3.  Do not use tampons or douche or take baths, do not have any sexual intercourse or anything inside the vagina for the next 6 weeks.  4. Take your pain medication as needed for pain, let us know if the pain is not well controlled despite pain medication use.  5. Get plenty of rest especially for the next two weeks to allow your body to recover.  You may feel tired in the process, this is normal. 6. Take your iron tablets daily for anemia.  You may also take a stool softener e.g colace if you are constipated.    7.  You may get a fever while at home, if you do, check your temperature and if it is equal to or greater than 100.4 please call the office.   8.  Please keep your upcoming appointment at the offices as scheduled.  9.  Remove the Honey comb dressing one week from your surgery date. To remove the honey comb dressing, first take a shower.  After showering pat the dressing dry then peel it off gently from the corners.  After honey comb comes out there will be steri strips left on the incision.  You may continue showering as usual and the steri strips may get wet.  After showering pat the steri strips dry. Allow the steri strips to fall off by themselves over time.  Do not rub the incision directly or put soap directly over the incision.  Always rinse off any soap over the incision and pat the incision dry.  10.  Some vaginal bleeding is expected and normal after your surgery. Please let us know if if it excessive where you saturate 1 pad in less than 2 hours or so.   Dr. Governor [facility]ck[facility] [phone].

## 2023-01-26 NOTE — Progress Notes (Signed)
I was called by the nurse to say the patient wants blood.  She just finished her iron transfusion.  No HA, no dizziness no CP BP 120/67   Pulse (!) 114   Temp 99.5 F (37.5 C) (Axillary)   Resp 18   Ht 5' 2" (1.575 m)   Wt 69.9 kg   LMP 04/22/2022   SpO2 98%   BMI 28.17 kg/m  The pt states she wants blood because her husband has had to help her stand and take care of her.  The husband and nurse questioned why she did not get blood. I reiterated the plan from this morning with the pt.  We both agreed she would get blood if she felt symptomatic and she wanted to try the iron transfusion first.  She says she remembered the convo now and would like to have two units of blood.  R&B reviewed. Two units ordered.   

## 2023-01-26 NOTE — Lactation Note (Signed)
This note was copied from a baby's chart. Lactation Consultation Note  Patient Name: Grace Lopez [id number] Date: 01/26/2023 Age:24 hours Reason for consult: Follow-up assessment;1st time breastfeeding LC entered the room, Birth Parent was receiving 2 units of blood with hx of anemia see Birth Parent's MR. Birth Parent is very tired, Support Person is assisting with formula feeding infant while Birth Parent rest and recover. Birth Parent still wants to breastfeed infant and if feeling better tomorrow morning would like latch assistance from [facility] services. Birth Parent has been having pain when using DEBP would like to be re-fitted with breast flanges. Support Person will continue to bottle feed infant tonight 8-12+ times within 24 hours pace feeding. Infant recently received 40 mls of 20 kcal formula.   Maternal Data    Feeding Mother's Current Feeding Choice: Breast Milk and Formula  LATCH Score                    Lactation Tools Discussed/Used    Interventions Interventions: Pace feeding;Education  Discharge    Consult Status Consult Status: Follow-up Date: 01/26/23 Follow-up type: In-patient    Frederico Hamman 01/26/2023, 1:30 AM

## 2023-01-27 LAB — BPAM RBC
Blood Product Expiration Date: 202406262359
Blood Product Expiration Date: 202406262359
ISSUE DATE / TIME: 202405230056
Unit Type and Rh: 1700
Unit Type and Rh: 1700

## 2023-01-27 LAB — TYPE AND SCREEN
ABO/RH(D): B NEG
Antibody Screen: POSITIVE
Unit division: 0
Unit division: 0

## 2023-01-27 NOTE — Discharge Summary (Signed)
Postpartum Discharge Summary     Patient Name: [place] Lopez DOB: [birth year] MRN: [id number]  Date of admission: 01/23/2023 Delivery date:01/24/2023  Delivering provider: Osborn Coho  Date of discharge: 01/26/23  Admitting diagnosis: Post-dates pregnancy [O48.0] Intrauterine pregnancy: [redacted]w[redacted]d     Secondary diagnosis:  Principal Problem:   Post-dates pregnancy Active Problems:   S/P cesarean section   Failure of fetal descent in labor, delivered, current hospitalization   Postpartum hemorrhage   Acute blood loss anemia (ABLA)     Discharge diagnosis: Term Pregnancy Delivered                                              Post partum procedures:blood transfusion Augmentation: AROM, Pitocin, and Cytotec Complications: Hemorrhage>108mL  Hospital course: Induction of Labor With Cesarean Section   22 y.o. yo G2P1011 at [redacted]w[redacted]d was admitted to the hospital 01/23/2023 for induction of labor. Patient had a labor course significant for arrest of descent. The patient went for cesarean section due to Arrest of Descent. Delivery details are as follows: Membrane Rupture Time/Date: 1:13 PM ,01/23/2023   Delivery Method:C-Section, Vacuum Assisted  Details of operation can be found in separate operative Note, EBL was 2236 cc.  Patient had a postpartum course complicated by  symptomatic anemia, she received 2 units blood transfusion with improvement in her symptoms.   By POD # 2 she was ambulating, tolerating a regular diet, passing flatus, and urinating well.  Patient  desired to be discharged on 5/23 and was discharged to home in stable condition.       Newborn Data: Birth date:01/24/2023  Birth time:9:24 AM  Gender:Female  Living status:Living  Apgars:9 ,9  Weight:4210 g                                Physical exam  Vitals:   01/26/23 0256 01/26/23 0341 01/26/23 0410 01/26/23 0540  BP: 121/87 126/88 120/77 130/83  Pulse: 93 99 80 84  Resp: 17 17 16 17   Temp: 98 F (36.7 C) 98 F  (36.7 C) 98 F (36.7 C) 97.7 F (36.5 C)  TempSrc: Oral Oral Oral Oral  SpO2:  99% 99% 100%  Weight:      Height:       General: alert, cooperative, and no distress Lochia: appropriate Uterine Fundus: firm Incision: Dressing is clean, dry, and intact DVT Evaluation: No evidence of DVT seen on physical exam. Calf/Ankle edema is present Labs: Lab Results  Component Value Date   WBC 10.2 01/26/2023   HGB 7.8 (L) 01/26/2023   HCT 23.8 (L) 01/26/2023   MCV 82.6 01/26/2023   PLT 180 01/26/2023      Latest Ref Rng & Units 01/25/2023    5:53 AM  CMP  Glucose 70 - 99 mg/dL 132   BUN 6 - 20 mg/dL 12   Creatinine 4.40 - 1.00 mg/dL 1.02   Sodium 725 - 366 mmol/L 135   Potassium 3.5 - 5.1 mmol/L 3.6   Chloride 98 - 111 mmol/L 102   CO2 22 - 32 mmol/L 21   Calcium 8.9 - 10.3 mg/dL 8.0   Total Protein 6.5 - 8.1 g/dL 4.5   Total Bilirubin 0.3 - 1.2 mg/dL 0.5   Alkaline Phos 38 - 126 U/L 54   AST 15 -  41 U/L 22   ALT 0 - 44 U/L 10       Latest Ref Rng & Units 01/26/2023   12:05 PM 01/25/2023    5:53 AM 01/23/2023    8:56 AM  CBC  WBC 4.0 - 10.5 K/uL 10.2  13.5  8.3   Hemoglobin 12.0 - 15.0 g/dL 7.8  6.0  16.1   Hematocrit 36.0 - 46.0 % 23.8  17.9  32.2   Platelets 150 - 400 K/uL 180  136  143     Edinburgh Score:    01/25/2023    8:30 AM  Edinburgh Postnatal Depression Scale Screening Tool  I have been able to laugh and see the funny side of things. 0  I have looked forward with enjoyment to things. 0  I have blamed myself unnecessarily when things went wrong. 2  I have been anxious or worried for no good reason. 1  I have felt scared or panicky for no good reason. 1  Things have been getting on top of me. 1  I have been so unhappy that I have had difficulty sleeping. 1  I have felt sad or miserable. 0  I have been so unhappy that I have been crying. 0  The thought of harming myself has occurred to me. 0  Edinburgh Postnatal Depression Scale Total 6     After visit  meds:  Allergies as of 01/26/2023   No Known Allergies      Medication List     STOP taking these medications    loperamide 2 MG capsule Commonly known as: IMODIUM   ondansetron 4 MG disintegrating tablet Commonly known as: ZOFRAN-ODT   potassium chloride SA 20 MEQ tablet Commonly known as: KLOR-CON M       TAKE these medications    acetaminophen 500 MG tablet Commonly known as: TYLENOL Take 500 mg by mouth every 6 (six) hours as needed for headache.   cholecalciferol 25 MCG (1000 UNIT) tablet Commonly known as: VITAMIN D3 Take 4,000 Units by mouth daily.   ferrous sulfate 325 (65 FE) MG tablet Take 325 mg by mouth daily with breakfast.   ibuprofen 600 MG tablet Commonly known as: ADVIL Take 1 tablet (600 mg total) by mouth every 6 (six) hours.   multivitamin-prenatal 27-0.8 MG Tabs tablet Take 1 tablet by mouth daily at 12 noon.   oxyCODONE 5 MG immediate release tablet Commonly known as: Oxy IR/ROXICODONE Take 1-2 tablets (5-10 mg total) by mouth every 4 (four) hours as needed for moderate pain.         Discharge home in stable condition Infant Feeding: Bottle Infant Disposition:home with mother Discharge instruction: per After Visit Summary and Postpartum booklet. Activity: Advance as tolerated. Pelvic rest for 6 weeks.  Diet: routine diet Future Appointments:No future appointments. Follow up Visit:  Follow-up Information     [facility], [facility]. Schedule an appointment as soon as possible for a visit in 1 week(s).   Specialty: Obstetrics and Gynecology Why: 1 week for incision and mood check 6 weeks for postpartum check Contact information: [address] [place] [address] [phone]                 Delivery mode:  C-Section, Vacuum Assisted  Anticipated Birth Control:  Unsure  01/27/2023 Prescilla Sours, MD

## 2023-02-01 ENCOUNTER — Telehealth (HOSPITAL_COMMUNITY): Payer: Self-pay | Admitting: *Deleted

## 2023-02-01 NOTE — Telephone Encounter (Signed)
Patient asked about appropriate activity level after c-section. Has only been taking Ibuprofen for pain management this week, per report. RN encouraged patient to be patient with healing process. Use bleeding amounts to help gauge activity level. Instructed patient to discuss specifics with OB at her appointment tomorrow. Patient voiced no other questions or concerns regarding her health at this time. EPDS not done. Patient stated, "I'm doing really good. We have lots of family support." Patient reported infant "spits up a lot." Continued, "We just ordered new nipples - size 1s thinking maybe size 2 is just too fast." RN instructed patient to call pediatrician if infant is spitting up half or more of her intake. Also encouraged patient to hold infant upright after a feed. Instructed patient to call pediatrician if concerns continue. Patient verbalized understanding. Patient voiced no other questions or concerns regarding infant at this time. RN reviewed ABCs of safe sleep. Patient verbalized understanding. Patient informed about hospital's postpartum classes and support groups. Declined email information at this time. Deforest Hoyles, RN, 02/01/23, [phone]

## 2023-07-27 ENCOUNTER — Other Ambulatory Visit: Payer: Self-pay

## 2024-01-13 IMAGING — US US OB < 14 WEEKS - US OB TV
1 series · 15 of 28 positions shown · non-contrast
Comparison: None Available.

CLINICAL DATA: Spotting. Estimated gestational age by LMP is 8
weeks 6 days. Quantitative beta HCG is not provided.

EXAM:
OBSTETRIC <14 WK US AND TRANSVAGINAL OB US
TECHNIQUE: Both transabdominal and transvaginal ultrasound examinations were
performed for complete evaluation of the gestation as well as the
maternal uterus, adnexal regions, and pelvic cul-de-sac.
Transvaginal technique was performed to assess early pregnancy.

[Series 1: us ob < 14 weeks - us ob tv · 15 of 49 slices shown]
[im 1/49]
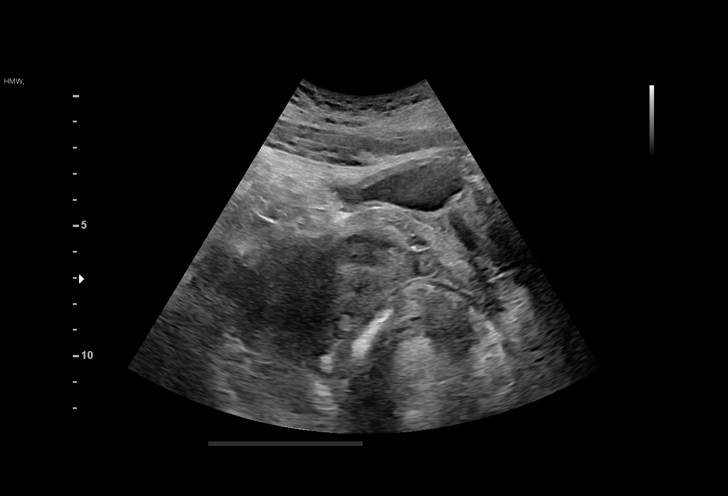
[im 4/49]
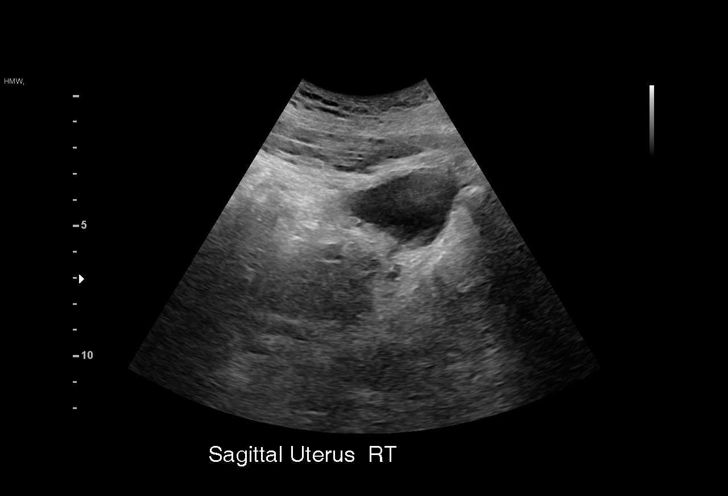
[im 8/49]
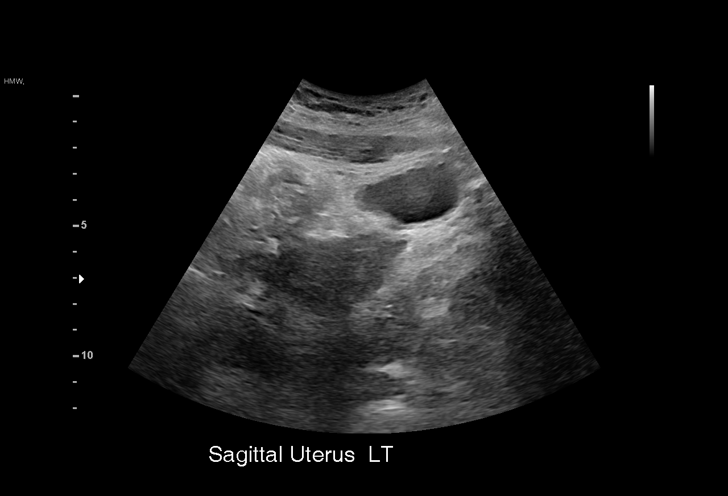
[im 11/49]
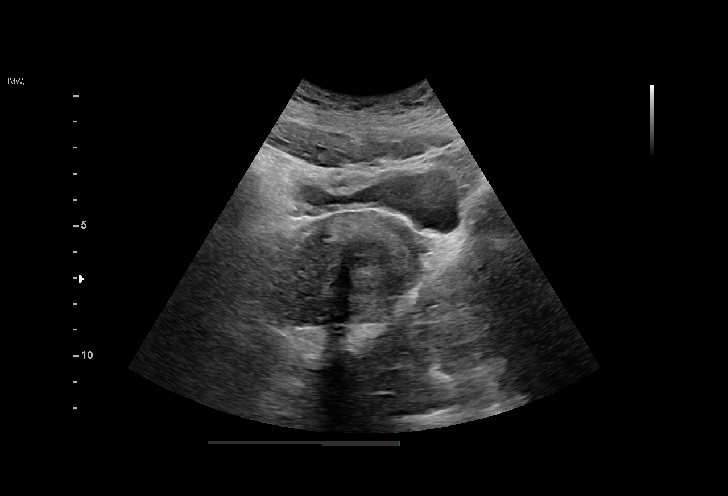
[im 15/49]
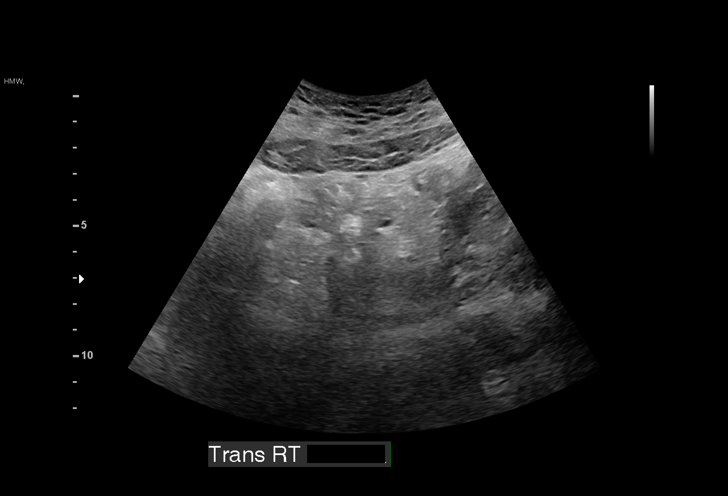
[im 18/49]
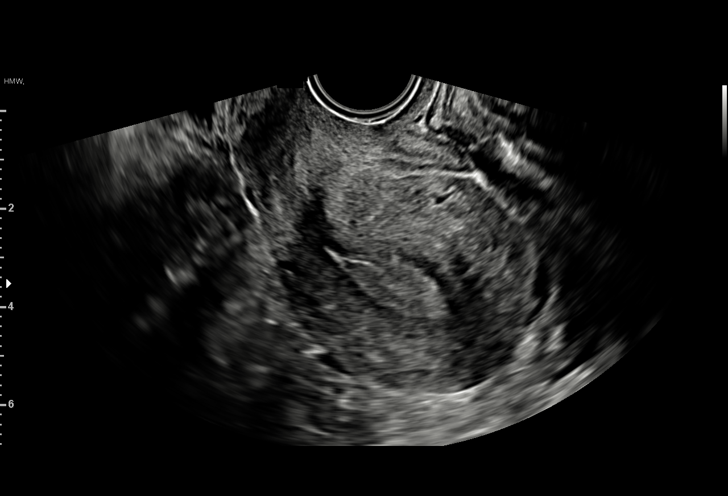
[im 22/49]
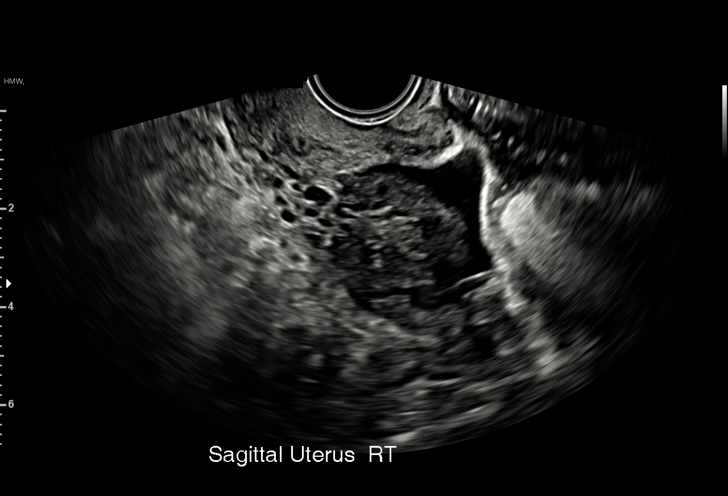
[im 25/49]
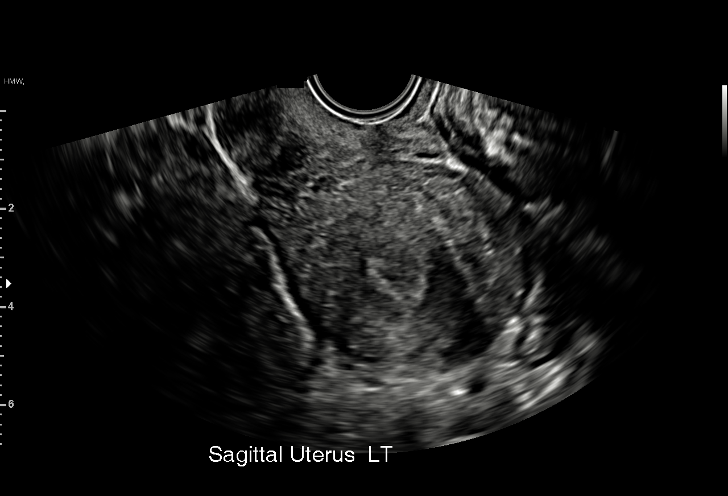
[im 27/49]
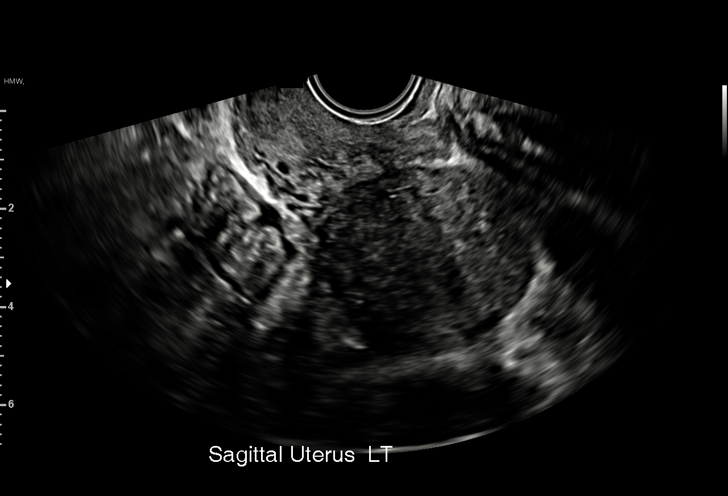
[im 31/49]
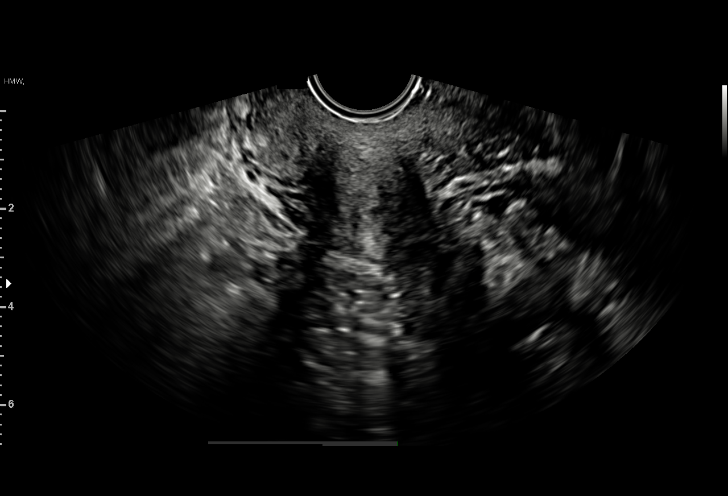
[im 34/49]
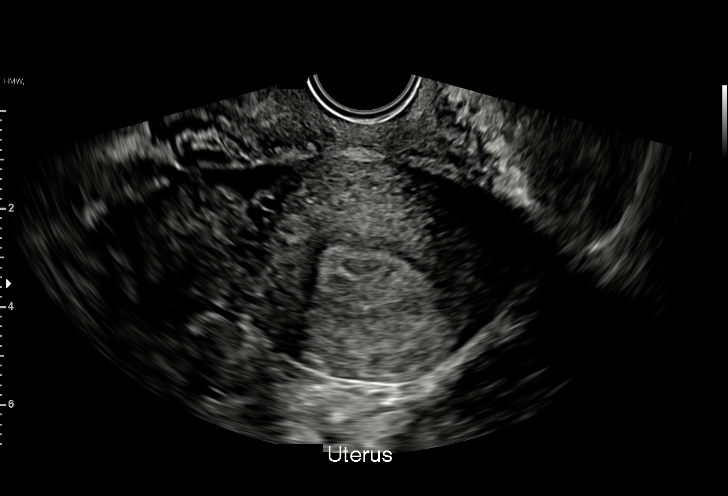
[im 38/49]
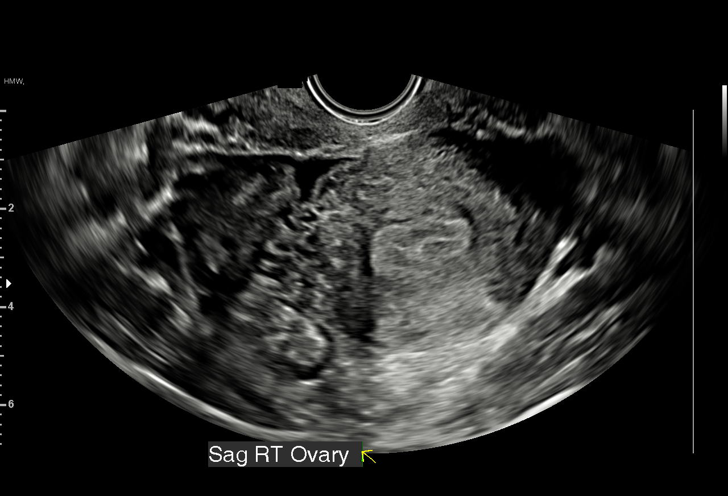
[im 41/49]
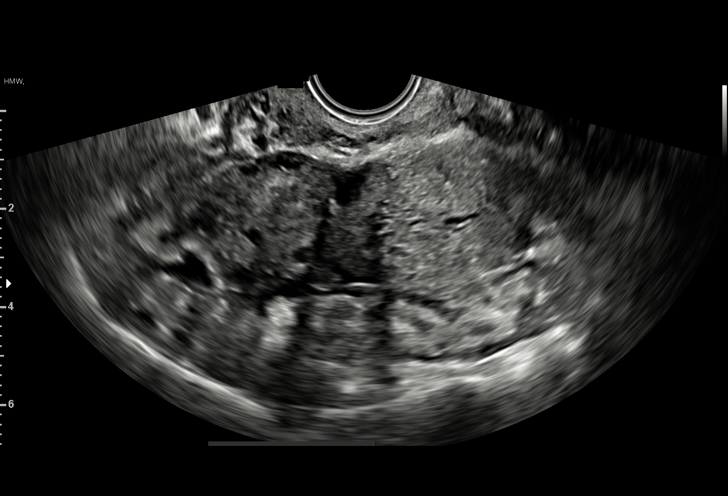
[im 45/49]
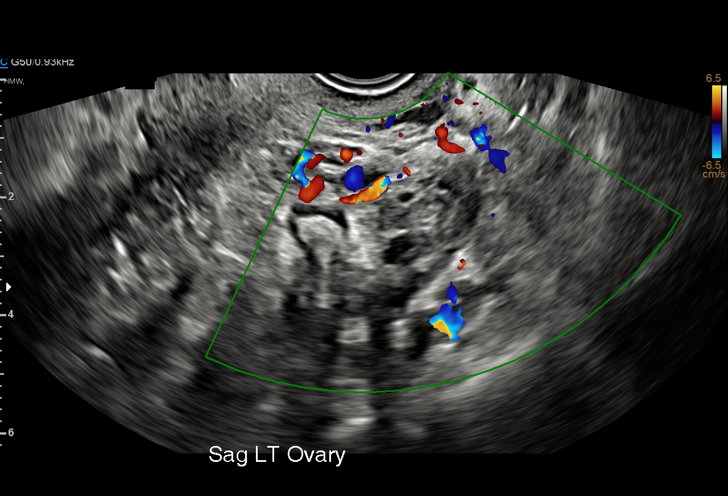
[im 49/49]
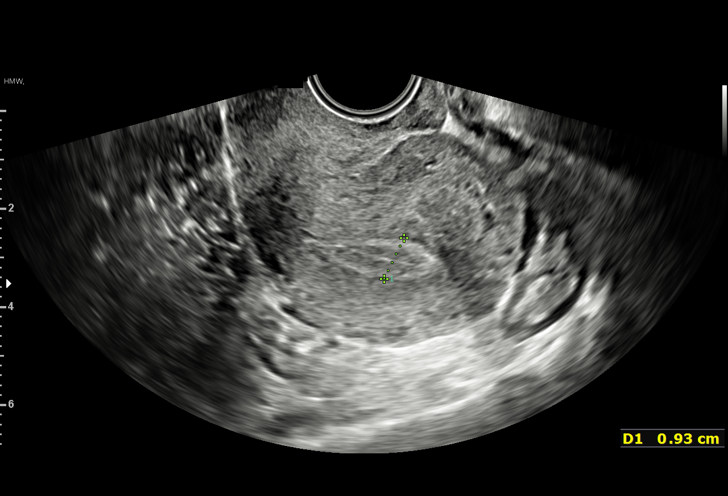

[15 of 28 positions shown; findings below may reference images not displayed]

FINDINGS: Intrauterine gestational sac: None

Yolk sac:  Not Visualized.

Embryo:  Not Visualized.

Cardiac Activity: Not Visualized.

Maternal uterus/adnexae: Uterus is retroverted. No myometrial mass
lesions are identified. Endometrial stripe thickness measures 14 mm.
No endometrial fluid or focal abnormality identified. Both ovaries
are visualized and appear normal. No abnormal adnexal masses are
seen. Minimal free fluid in the pelvis.
IMPRESSION: No intrauterine gestational sac, yolk sac, or fetal pole identified.
Differential considerations include intrauterine pregnancy too early
to be sonographically visualized, missed abortion, or ectopic
pregnancy. Followup ultrasound is recommended in 10-14 days for
further evaluation.
